# Patient Record
Sex: Male | Born: 1995 | Race: Black or African American | Hispanic: No | Marital: Single | Smoking: Current some day smoker
Health system: Southern US, Community
[De-identification: ages and names within clinical notes are randomized; demographics above are authoritative.]

## PROBLEM LIST (undated history)

## (undated) ENCOUNTER — Ambulatory Visit (HOSPITAL_COMMUNITY): Admission: EM

## (undated) ENCOUNTER — Ambulatory Visit (HOSPITAL_COMMUNITY): Discharge status: Absent without leave | Payer: Self-pay

## (undated) ENCOUNTER — Ambulatory Visit (HOSPITAL_COMMUNITY)

## (undated) DIAGNOSIS — K5090 Crohn's disease, unspecified, without complications: Secondary | ICD-10-CM

## (undated) DIAGNOSIS — A6000 Herpesviral infection of urogenital system, unspecified: Secondary | ICD-10-CM

---
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
## 2017-04-17 ENCOUNTER — Encounter (HOSPITAL_COMMUNITY): Payer: Self-pay

## 2017-04-17 ENCOUNTER — Ambulatory Visit (HOSPITAL_COMMUNITY)
Admission: EM | Admit: 2017-04-17 | Discharge: 2017-04-17 | Disposition: A | Discharge status: Home / Self Care | Payer: Medicaid Other | Attending: Family Medicine | Admitting: Family Medicine

## 2017-04-17 DIAGNOSIS — L739 Follicular disorder, unspecified: Secondary | ICD-10-CM

## 2017-04-17 HISTORY — DX: Herpesviral infection of urogenital system, unspecified: A60.00

## 2017-04-17 MED ORDER — CEPHALEXIN 500 MG PO CAPS: 500.0000 mg | ORAL_CAPSULE | Freq: Three times a day (TID) | ORAL | 0 refills | Status: DC

## 2017-04-17 NOTE — ED Triage Notes (Signed)
Pt complains today of a rash around genital area that he noticed 3 days ago. States there is not itching or burning associated with rash. No urinary sxs noticed.

## 2017-04-17 NOTE — ED Provider Notes (Signed)
  [facility]   [id number] 04/17/17 Arrival Time: 1200  ASSESSMENT & PLAN:  1. Folliculitis    Meds ordered this encounter  Medications  . cephALEXin (KEFLEX) 500 MG capsule    Sig: Take 1 capsule (500 mg total) by mouth 3 (three) times daily.    Dispense:  21 capsule    Refill:  0   Also desires testing for GC/Chlamydia. Urine sent. Will contact with results. No empiric treatment.  Reviewed expectations re: course of current medical issues. Questions answered. Outlined signs and symptoms indicating need for more acute intervention. Patient verbalized understanding. After Visit Summary given.   SUBJECTIVE:  Henry Lin is a 21 y.o. male who presents with complaint of "bumps" on proximal penis. Noticed approx 2 evenings ago. Improving. Not painful. Mild itch. Afebrile. Desires testing for GC/Chlamydia. No penile d/c. Is sexually active with male partner. Occas condom use.  ROS: As per HPI.   OBJECTIVE:  Vitals:   04/17/17 1216  BP: 107/77  Pulse: 81  Temp: 98.4 F (36.9 C)  SpO2: 96%     General appearance: alert; no distress Extremities: no cyanosis or edema; symmetrical with no gross deformities Skin: proximal penis with mild inflammation around hair follicles; no sites of drainage; non-tender; no ulcerations   Labs Reviewed  URINE CYTOLOGY ANCILLARY ONLY    No Known Allergies  PMHx, SurgHx, SocialHx, Medications, and Allergies were reviewed in the Visit Navigator and updated as appropriate.      Mardella Layman, MD 04/17/17 1240

## 2017-04-19 LAB — URINE CYTOLOGY ANCILLARY ONLY
CHLAMYDIA, DNA PROBE: NEGATIVE
NEISSERIA GONORRHEA: NEGATIVE

## 2017-07-15 ENCOUNTER — Encounter (HOSPITAL_COMMUNITY): Payer: Self-pay | Admitting: Family Medicine

## 2017-07-15 ENCOUNTER — Ambulatory Visit (HOSPITAL_COMMUNITY)
Admission: EM | Admit: 2017-07-15 | Discharge: 2017-07-15 | Disposition: A | Discharge status: Home / Self Care | Payer: Medicaid Other | Attending: Family Medicine | Admitting: Family Medicine

## 2017-07-15 DIAGNOSIS — Z113 Encounter for screening for infections with a predominantly sexual mode of transmission: Secondary | ICD-10-CM | POA: Diagnosis not present

## 2017-07-15 DIAGNOSIS — Z202 Contact with and (suspected) exposure to infections with a predominantly sexual mode of transmission: Secondary | ICD-10-CM | POA: Diagnosis not present

## 2017-07-15 MED ORDER — VALACYCLOVIR HCL 500 MG PO TABS: 500.0000 mg | ORAL_TABLET | Freq: Two times a day (BID) | ORAL | 11 refills | Status: DC

## 2017-07-15 MED ORDER — LIDOCAINE HCL (PF) 1 % IJ SOLN
INTRAMUSCULAR | Status: AC
  Filled 2017-07-15: qty 2

## 2017-07-15 MED ORDER — CEFTRIAXONE SODIUM 250 MG IJ SOLR
INTRAMUSCULAR | Status: AC
  Filled 2017-07-15: qty 250

## 2017-07-15 MED ORDER — AZITHROMYCIN 250 MG PO TABS
1000.0000 mg | ORAL_TABLET | Freq: Once | ORAL | Status: AC
  Administered 2017-07-15: 1000 mg via ORAL

## 2017-07-15 MED ORDER — AZITHROMYCIN 250 MG PO TABS
ORAL_TABLET | ORAL | Status: AC
  Filled 2017-07-15: qty 4

## 2017-07-15 MED ORDER — CEFTRIAXONE SODIUM 250 MG IJ SOLR
250.0000 mg | Freq: Once | INTRAMUSCULAR | Status: AC
  Administered 2017-07-15: 250 mg via INTRAMUSCULAR

## 2017-07-15 NOTE — ED Provider Notes (Signed)
  [facility]   [id number] 07/15/17 Arrival Time: 1001   SUBJECTIVE:  Henry Lin is a 21 y.o. male who presents to the urgent care with complaint of tingling in tip of penis and has noticed discharge for the same period of time.  One week.  Had chlamydia in past.  No rash lately.  Takes Valtrex prn  Phone is out of service but patient knows to check mychart     Past Medical History:  Diagnosis Date  . Crohn's disease (HCC)   . Genital herpes    No family history on file. Social History   Social History  . Marital status: Single    Spouse name: N/A  . Number of children: N/A  . Years of education: N/A   Occupational History  . Not on file.   Social History Main Topics  . Smoking status: Never Smoker  . Smokeless tobacco: Never Used  . Alcohol use No  . Drug use: No  . Sexual activity: Yes     Comment: condoms   Other Topics Concern  . Not on file   Social History Narrative  . No narrative on file   No outpatient prescriptions have been marked as taking for the 07/15/17 encounter [facility] Encounter).   No Known Allergies    ROS: As per HPI, remainder of ROS negative.   OBJECTIVE:   Vitals:   07/15/17 1011  BP: 119/72  Pulse: (!) 101  Resp: 18  Temp: 97.6 F (36.4 C)  TempSrc: Oral  SpO2: 98%     General appearance: alert; no distress Eyes: PERRL; EOMI; conjunctiva normal HENT: normocephalic; atraumatic; external ears normal without trauma; nasal mucosa normal; oral mucosa normal Neck: supple Genitalia:  Dry exudate at tip of penis.  No rash Skin: warm and dry Neurologic: normal gait; grossly normal Psychological: alert and cooperative; normal mood and affect      Labs:  Results for orders placed or performed during the hospital encounter of 04/17/17  Urine cytology ancillary only  Result Value Ref Range   Chlamydia Negative    Neisseria gonorrhea Negative     Labs Reviewed  URINE CYTOLOGY ANCILLARY ONLY     No results found.     ASSESSMENT & PLAN:  1. STD exposure     Meds ordered this encounter  Medications  . azithromycin (ZITHROMAX) tablet 1,000 mg  . cefTRIAXone (ROCEPHIN) injection 250 mg  . valACYclovir (VALTREX) 500 MG tablet    Sig: Take 1 tablet (500 mg total) by mouth 2 (two) times daily.    Dispense:  6 tablet    Refill:  11    Reviewed expectations re: course of current medical issues. Questions answered. Outlined signs and symptoms indicating need for more acute intervention. Patient verbalized understanding. After Visit Summary given.    Procedures:      Elvina Sidle, MD 07/15/17 1018

## 2017-07-15 NOTE — ED Triage Notes (Signed)
Tinging in tip of penis and has noticed discharge for the same period of time

## 2017-07-16 LAB — URINE CYTOLOGY ANCILLARY ONLY
Chlamydia: NEGATIVE
Neisseria Gonorrhea: NEGATIVE
Trichomonas: NEGATIVE

## 2017-07-25 ENCOUNTER — Other Ambulatory Visit: Payer: Self-pay

## 2017-07-25 ENCOUNTER — Emergency Department (HOSPITAL_COMMUNITY)
Admission: EM | Admit: 2017-07-25 | Discharge: 2017-07-25 | Disposition: A | Discharge status: Home / Self Care | Payer: Self-pay | Attending: Emergency Medicine | Admitting: Emergency Medicine

## 2017-07-25 ENCOUNTER — Ambulatory Visit (HOSPITAL_COMMUNITY)
Admission: EM | Admit: 2017-07-25 | Discharge: 2017-07-25 | Disposition: A | Discharge status: Home / Self Care | Payer: Self-pay

## 2017-07-25 ENCOUNTER — Encounter (HOSPITAL_COMMUNITY): Payer: Self-pay | Admitting: Emergency Medicine

## 2017-07-25 ENCOUNTER — Emergency Department (HOSPITAL_COMMUNITY): Payer: Self-pay

## 2017-07-25 DIAGNOSIS — M542 Cervicalgia: Secondary | ICD-10-CM | POA: Insufficient documentation

## 2017-07-25 DIAGNOSIS — M549 Dorsalgia, unspecified: Secondary | ICD-10-CM | POA: Insufficient documentation

## 2017-07-25 DIAGNOSIS — M5489 Other dorsalgia: Secondary | ICD-10-CM

## 2017-07-25 DIAGNOSIS — G4489 Other headache syndrome: Secondary | ICD-10-CM | POA: Insufficient documentation

## 2017-07-25 DIAGNOSIS — Z79899 Other long term (current) drug therapy: Secondary | ICD-10-CM | POA: Insufficient documentation

## 2017-07-25 LAB — CBC WITH DIFFERENTIAL/PLATELET
BASOS ABS: 0.2 10*3/uL — AB (ref 0.0–0.1)
BASOS PCT: 4 %
EOS PCT: 1 %
Eosinophils Absolute: 0.1 10*3/uL (ref 0.0–0.7)
HEMATOCRIT: 41.3 % (ref 39.0–52.0)
HEMOGLOBIN: 13.6 g/dL (ref 13.0–17.0)
LYMPHS PCT: 53 %
Lymphs Abs: 2.8 10*3/uL (ref 0.7–4.0)
MCH: 25.4 pg — ABNORMAL LOW (ref 26.0–34.0)
MCHC: 32.9 g/dL (ref 30.0–36.0)
MCV: 77.1 fL — ABNORMAL LOW (ref 78.0–100.0)
MONOS PCT: 11 %
Monocytes Absolute: 0.6 10*3/uL (ref 0.1–1.0)
NEUTROS ABS: 1.6 10*3/uL — AB (ref 1.7–7.7)
NEUTROS PCT: 31 %
Platelets: 169 10*3/uL (ref 150–400)
RBC: 5.36 MIL/uL (ref 4.22–5.81)
RDW: 14.2 % (ref 11.5–15.5)
WBC: 5.3 10*3/uL (ref 4.0–10.5)

## 2017-07-25 LAB — BASIC METABOLIC PANEL
ANION GAP: 7 (ref 5–15)
BUN: 8 mg/dL (ref 6–20)
CHLORIDE: 106 mmol/L (ref 101–111)
CO2: 23 mmol/L (ref 22–32)
Calcium: 9.1 mg/dL (ref 8.9–10.3)
Creatinine, Ser: 0.91 mg/dL (ref 0.61–1.24)
GFR calc non Af Amer: >60 mL/min (ref >60)
Glucose, Bld: 81 mg/dL (ref 65–99)
Potassium: 4 mmol/L (ref 3.5–5.1)
Sodium: 136 mmol/L (ref 135–145)

## 2017-07-25 MED ORDER — ONDANSETRON HCL 4 MG/2ML IJ SOLN
4.0000 mg | Freq: Once | INTRAMUSCULAR | Status: AC
  Administered 2017-07-25: 4 mg via INTRAVENOUS
  Filled 2017-07-25: qty 2

## 2017-07-25 MED ORDER — CYCLOBENZAPRINE HCL 5 MG PO TABS: 5.0000 mg | ORAL_TABLET | Freq: Two times a day (BID) | ORAL | 0 refills | Status: DC | PRN

## 2017-07-25 MED ORDER — SODIUM CHLORIDE 0.9 % IV SOLN
Freq: Once | INTRAVENOUS | Status: AC
  Administered 2017-07-25: 21:00:00 via INTRAVENOUS

## 2017-07-25 MED ORDER — DEXAMETHASONE SODIUM PHOSPHATE 10 MG/ML IJ SOLN
10.0000 mg | Freq: Once | INTRAMUSCULAR | Status: AC
  Administered 2017-07-25: 10 mg via INTRAVENOUS
  Filled 2017-07-25: qty 1

## 2017-07-25 MED ORDER — DIPHENHYDRAMINE HCL 50 MG/ML IJ SOLN
25.0000 mg | Freq: Once | INTRAMUSCULAR | Status: AC
  Administered 2017-07-25: 25 mg via INTRAVENOUS
  Filled 2017-07-25: qty 1

## 2017-07-25 NOTE — ED Notes (Signed)
EDP at bedside  

## 2017-07-25 NOTE — ED Notes (Signed)
Patient Alert and oriented X4. Stable and ambulatory. Patient verbalized understanding of the discharge instructions.  Patient belongings were taken by the patient.  

## 2017-07-25 NOTE — Discharge Instructions (Signed)
Take tylenol and ibuprofen as needed. If you take the muscle relaxant do not drive as it can make you sleepy. Follow up with your doctor or return here for worsening symptoms.

## 2017-07-25 NOTE — ED Triage Notes (Signed)
C/o pain from back of head to lower back.  Pt states pain started 9 days ago as posterior neck pain.  He noticed a lump in the back of his neck that seemed to get smaller and then pain started to radiate down to lower back and up to back of head.  Denies fever, nausea, vomiting, or visual disturbance.

## 2017-07-25 NOTE — ED Provider Notes (Signed)
[facility] EMERGENCY DEPARTMENT Provider Note   CSN: [id number] Arrival date & time: 07/25/17  1743     History   Chief Complaint Chief Complaint  Patient presents with  . Neck Pain  . Back Pain  . Headache    HPI Henry Lin is a 21 y.o. male who presents to the ED with neck pain, headache and back pain that started 8 days ago and has gotten worse. Patient reports that he has a hx of migraines but this one won't go away. Patient denies fever, n/v visual changes.   The history is provided by the patient. No language interpreter was used.  Neck Pain   This is a new problem. The current episode started more than 1 week ago. The problem occurs constantly. The problem has been gradually worsening. The pain is associated with nothing. There has been no fever. The pain is present in the occipital region. The quality of the pain is described as aching. The pain is at a severity of 9/10. The symptoms are aggravated by position and twisting. The pain is the same all the time. Associated symptoms include headaches. Pertinent negatives include no photophobia, no visual change, no chest pain, no syncope, no bowel incontinence, no bladder incontinence, no leg pain and no weakness. He has tried muscle relaxants for the symptoms. The treatment provided mild relief.  Back Pain   This is a new problem. The current episode started more than 2 days ago. The problem occurs constantly. The problem has been gradually worsening. The pain is associated with no known injury. The pain is present in the lumbar spine. The quality of the pain is described as aching. The pain does not radiate. The pain is at a severity of 10/10. The symptoms are aggravated by bending and twisting. The pain is the same all the time. Associated symptoms include headaches. Pertinent negatives include no chest pain, no fever, no abdominal pain, no bowel incontinence, no bladder incontinence, no dysuria, no leg pain  and no weakness.  Headache   This is a new problem. The current episode started more than 2 days ago (3 days ago). The problem occurs every few hours. The problem has been gradually worsening. The pain is located in the occipital and frontal region. The pain is at a severity of 10/10. The pain radiates to the upper back. Pertinent negatives include no fever, no near-syncope, no syncope, no shortness of breath, no nausea and no vomiting.    Past Medical History:  Diagnosis Date  . Crohn's disease (HCC)   . Genital herpes     There are no active problems to display for this patient.   History reviewed. No pertinent surgical history.     Home Medications    Prior to Admission medications   Medication Sig Start Date End Date Taking? Authorizing Provider  cyclobenzaprine (FLEXERIL) 5 MG tablet Take 1 tablet (5 mg total) 2 (two) times daily as needed by mouth for muscle spasms (neck and back spasm). 07/25/17   Janne Napoleon, NP  valACYclovir (VALTREX) 500 MG tablet Take 1 tablet (500 mg total) by mouth 2 (two) times daily. 07/15/17   Elvina Sidle, MD    Family History No family history on file.  Social History Social History   Tobacco Use  . Smoking status: Never Smoker  . Smokeless tobacco: Never Used  Substance Use Topics  . Alcohol use: No  . Drug use: No     Allergies  Patient has no known allergies.   Review of Systems Review of Systems  Constitutional: Negative for diaphoresis, fever and unexpected weight change.  HENT: Negative.   Eyes: Negative for photophobia and visual disturbance.  Respiratory: Negative for cough and shortness of breath.   Cardiovascular: Negative for chest pain, syncope and near-syncope.  Gastrointestinal: Negative for abdominal pain, bowel incontinence, constipation, diarrhea, nausea and vomiting.  Genitourinary: Negative for bladder incontinence, decreased urine volume, discharge, dysuria, frequency and hematuria.  Musculoskeletal:  Positive for back pain and neck pain.  Skin: Negative for rash and wound.  Neurological: Positive for light-headedness and headaches. Negative for syncope and weakness.  Psychiatric/Behavioral: Negative for confusion. The patient is not nervous/anxious.      Physical Exam Updated Vital Signs BP 123/68 (BP Location: Right Arm)   Pulse 71   Temp 98 F (36.7 C) (Oral)   Resp 16   Ht 5\' 10"  (1.778 m)   Wt 83.9 kg (185 lb)   SpO2 100%   BMI 26.54 kg/m   Physical Exam  Constitutional: He appears well-developed and well-nourished. No distress.  HENT:  Head: Normocephalic and atraumatic.  Right Ear: Tympanic membrane normal.  Left Ear: Tympanic membrane normal.  Nose: Nose normal.  Mouth/Throat: Uvula is midline and oropharynx is clear and moist.  Eyes: EOM are normal. Pupils are equal, round, and reactive to light.  Neck: Normal range of motion. Neck supple.  Cardiovascular: Normal rate and regular rhythm.  Pulmonary/Chest: Effort normal. No respiratory distress. He has no wheezes. He has no rales.  Abdominal: Soft. Bowel sounds are normal. There is no tenderness.  Musculoskeletal: Normal range of motion. He exhibits no edema.       Lumbar back: He exhibits tenderness. He exhibits normal range of motion, no deformity, no spasm and normal pulse.  Neurological: He is alert. He has normal strength. No cranial nerve deficit or sensory deficit. He displays a negative Romberg sign. Coordination and gait normal.  Reflex Scores:      Bicep reflexes are 2+ on the right side and 2+ on the left side.      Brachioradialis reflexes are 2+ on the right side and 2+ on the left side.      Patellar reflexes are 2+ on the right side and 2+ on the left side. Skin: Skin is warm and dry.  Psychiatric: He has a normal mood and affect. His behavior is normal.  Nursing note and vitals reviewed.  8:50 pm Dr. Ethelda Chick in to examine the patient.  Will continue plan to CT head and IV fluids and headache  cocktail of Benadryl 25 mg IV, Zofran 4 mg IV, Decadron 10 mg IV., labs and observe.  Dr. Ethelda Chick discussed with patient possibility of LP, however, since patient's symptoms started over a week ago and he has had no fever and no neuro deficits patient states that he does not want the LP if symptoms improve with treatment.   ED Treatments / Results  Labs (all labs ordered are listed, but only abnormal results are displayed) Labs Reviewed  CBC WITH DIFFERENTIAL/PLATELET - Abnormal; Notable for the following components:      Result Value   MCV 77.1 (*)    MCH 25.4 (*)    Neutro Abs 1.6 (*)    Basophils Absolute 0.2 (*)    All other components within normal limits  BASIC METABOLIC PANEL     Radiology Ct Head Wo Contrast  Result Date: 07/25/2017 CLINICAL DATA:  Acute headache. Started 9  days ago. Posterior neck pain. Lump in the back of the neck. EXAM: CT HEAD WITHOUT CONTRAST TECHNIQUE: Contiguous axial images were obtained from the base of the skull through the vertex without intravenous contrast. COMPARISON:  None. FINDINGS: Brain: No evidence of acute infarction, hemorrhage, hydrocephalus, extra-axial collection or mass lesion/mass effect. Vascular: No hyperdense vessel or unexpected calcification. Skull: Normal. Negative for fracture or focal lesion. Sinuses/Orbits: Retention cysts in the maxillary antra. No acute air-fluid levels in the paranasal sinuses. Mastoid air cells are not opacified. Other: None. IMPRESSION: No acute intracranial abnormalities. Electronically Signed   By: Burman NievesWilliam  Stevens M.D.   On: 07/25/2017 22:36    Procedures Procedures (including critical care time)  Medications Ordered in ED Medications  0.9 %  sodium chloride infusion ( Intravenous Stopped 07/25/17 2241)  dexamethasone (DECADRON) injection 10 mg (10 mg Intravenous Given 07/25/17 2123)  diphenhydrAMINE (BENADRYL) injection 25 mg (25 mg Intravenous Given 07/25/17 2123)  ondansetron (ZOFRAN) injection 4  mg (4 mg Intravenous Given 07/25/17 2123)     Initial Impression / Assessment and Plan / ED Course  I have reviewed the triage vital signs and the nursing notes. 21 y.o. male with headache and neck pain stable for d/c with headache completely resolved with medications and IV hydration during his ED visit. Discussed with the patient importance of staying well hydrated and need for f/u with his PCP. Patient voices understanding and agrees with plan. Discussed s/s that would indicate need for return to the ED.   Final Clinical Impressions(s) / ED Diagnoses   Final diagnoses:  Other headache syndrome  Neck pain  Back pain without sciatica    ED Discharge Orders    None       Kerrie Buffaloeese, Hope Renner CornerM, NP 07/26/17 [id number]    Doug SouJacubowitz, Sam, MD 07/26/17 [phone]

## 2017-07-25 NOTE — ED Provider Notes (Addendum)
Complains of posterior neck pain and diffuse back pain covering the entire back onset 1 week ago accompanied by diffuse headaches.  No fever.  He is treated himself with  Advil prior to coming here with significant relief.  No nausea or vomiting no rash.  No one else at home sick.  Denies any sore throat.  On exam with patient is alert nontoxic HEENT exam normocephalic atraumatic no facial asymmetry eyes normal neck is supple he has mild pain on flexion of his neck at the posterior neck.  He is able to flex his neck easily.  Lungs clear to auscultation heart regular rate and rhythm no murmurs neurologic Glasgow Coma Score 15 cranial nerves II through XII grossly intact moves all extremities well. I strongly doubt meningitis.  I discussed with patient and with his mother that if he had meningitis of the bacterial sort lasting 1 week he would likely be very ill with high fever.  I do not feel that emergent LP is indicated.  Patient in agreement.   Doug Sou, MD 07/25/17 2132    Doug Sou, MD 07/25/17 2214

## 2017-12-06 ENCOUNTER — Ambulatory Visit (INDEPENDENT_AMBULATORY_CARE_PROVIDER_SITE_OTHER): Payer: Self-pay

## 2017-12-06 ENCOUNTER — Ambulatory Visit (HOSPITAL_COMMUNITY)
Admission: EM | Admit: 2017-12-06 | Discharge: 2017-12-06 | Disposition: A | Discharge status: Home / Self Care | Payer: Self-pay | Attending: Internal Medicine | Admitting: Internal Medicine

## 2017-12-06 ENCOUNTER — Encounter (HOSPITAL_COMMUNITY): Payer: Self-pay | Admitting: Emergency Medicine

## 2017-12-06 DIAGNOSIS — M76891 Other specified enthesopathies of right lower limb, excluding foot: Secondary | ICD-10-CM

## 2017-12-06 MED ORDER — NAPROXEN 500 MG PO TABS: 500.0000 mg | ORAL_TABLET | Freq: Two times a day (BID) | ORAL | 0 refills | Status: DC

## 2017-12-06 NOTE — ED Provider Notes (Signed)
[facility]    CSN: [id number] Arrival date & time: 12/06/17  1217     History   Chief Complaint Chief Complaint  Patient presents with  . Knee Pain    HPI Henry Lin is a 22 y.o. male.   Henry Lin presents with complaints of right knee pain after playing football two days ago. He states he had not stretched prior and was running routes and sprints. He developed posterior knee pain but was able to continue. Then started playing a game, another player ran into him striking his lateral knee, increasing the pain to his posterior knee. He states he was unable to walk that day. He is now ambulatory with a limp. Pain with knee extension. Pain is 8/10. Denies previous knee injury. Has not taken any medications for pain. Applied ice originally but has not since. Without weakness, numbness or tingling. History of crohns. Is not taking any medications regularly.   ROS per HPI.       Past Medical History:  Diagnosis Date  . Crohn's disease (HCC)   . Genital herpes     There are no active problems to display for this patient.   History reviewed. No pertinent surgical history.     Home Medications    Prior to Admission medications   Medication Sig Start Date End Date Taking? Authorizing Provider  cyclobenzaprine (FLEXERIL) 5 MG tablet Take 1 tablet (5 mg total) 2 (two) times daily as needed by mouth for muscle spasms (neck and back spasm). 07/25/17   Janne Napoleon, NP  naproxen (NAPROSYN) 500 MG tablet Take 1 tablet (500 mg total) by mouth 2 (two) times daily. 12/06/17   Georgetta Haber, NP  valACYclovir (VALTREX) 500 MG tablet Take 1 tablet (500 mg total) by mouth 2 (two) times daily. 07/15/17   Elvina Sidle, MD    Family History History reviewed. No pertinent family history.  Social History Social History   Tobacco Use  . Smoking status: Never Smoker  . Smokeless tobacco: Never Used  Substance Use Topics  . Alcohol use: No  . Drug use: No      Allergies   Patient has no known allergies.   Review of Systems Review of Systems   Physical Exam Triage Vital Signs ED Triage Vitals [12/06/17 1335]  Enc Vitals Group     BP 123/67     Pulse Rate 67     Resp 18     Temp 98.5 F (36.9 C)     Temp Source Oral     SpO2 100 %     Weight      Height      Head Circumference      Peak Flow      Pain Score      Pain Loc      Pain Edu?      Excl. in GC?    No data found.  Updated Vital Signs BP 123/67 (BP Location: Left Arm)   Pulse 67   Temp 98.5 F (36.9 C) (Oral)   Resp 18   SpO2 100%   Visual Acuity Right Eye Distance:   Left Eye Distance:   Bilateral Distance:    Right Eye Near:   Left Eye Near:    Bilateral Near:     Physical Exam  Constitutional: He is oriented to person, place, and time. He appears well-developed and well-nourished.  Cardiovascular: Normal rate and regular rhythm.  Pulmonary/Chest: Effort normal and breath sounds normal.  Musculoskeletal:       Right knee: He exhibits decreased range of motion. He exhibits no swelling, no effusion, no ecchymosis, no deformity, no laceration, no erythema, normal alignment, no LCL laxity, normal patellar mobility, no bony tenderness, normal meniscus and no MCL laxity.       Legs: Pain at posterior knee on palpation as well as wrapping around to medial knee; pain with knee extension both passively and actively; pain with lifting of thigh with engagement of quadricepts and hamstring; negative anterior drawer sign; without laxity noted but with pain with stress to medial knee; sensation intact; strong pedal pulses; ambulatory with limp; without swelling or bruising noted  Neurological: He is alert and oriented to person, place, and time.  Skin: Skin is warm and dry.     UC Treatments / Results  Labs (all labs ordered are listed, but only abnormal results are displayed) Labs Reviewed - No data to display  EKG  EKG Interpretation None        Radiology Dg Knee Complete 4 Views Right  Result Date: 12/06/2017 CLINICAL DATA:  Injury while playing football EXAM: RIGHT KNEE - COMPLETE 4+ VIEW COMPARISON:  June 09, 2015 FINDINGS: Frontal, lateral, and bilateral oblique views were obtained. There is no fracture or dislocation. No joint effusion. The joint spaces appear normal. No erosive change. IMPRESSION: No fracture or dislocation. No joint effusion. No evident arthropathy. Electronically Signed   By: Bretta Bang III M.D.   On: 12/06/2017 13:48    Procedures Procedures (including critical care time)  Medications Ordered in UC Medications - No data to display   Initial Impression / Assessment and Plan / UC Course  I have reviewed the triage vital signs and the nursing notes.  Pertinent labs & imaging results that were available during my care of the patient were reviewed by me and considered in my medical decision making (see chart for details).     Likely hamstring tendon strain vs tendonitis. ACE wrap and ice applied. Naproxen twice a day, take with food. Activity as tolerated. Ice and elevation. Follow up with orthopedics as needed if no improvement in the next 10 days. Patient verbalized understanding and agreeable to plan.    Final Clinical Impressions(s) / UC Diagnoses   Final diagnoses:  Hamstring tendonitis of right thigh    ED Discharge Orders        Ordered    naproxen (NAPROSYN) 500 MG tablet  2 times daily     12/06/17 1431       Controlled Substance Prescriptions [place] Controlled Substance Registry consulted? Not Applicable   Georgetta Haber, NP 12/06/17 1442

## 2017-12-06 NOTE — Discharge Instructions (Signed)
Ice, elevation, naproxen twice a day, use of compression. Activity as tolerated. If no improvement or worsening in the next 10 days please follow up with orthopedics

## 2017-12-06 NOTE — ED Triage Notes (Signed)
Pt sts right knee pain after twisting knee on Saturday

## 2018-08-27 ENCOUNTER — Ambulatory Visit (HOSPITAL_COMMUNITY)
Admission: EM | Admit: 2018-08-27 | Discharge: 2018-08-27 | Disposition: A | Discharge status: Home / Self Care | Payer: Medicaid Other | Attending: Internal Medicine | Admitting: Internal Medicine

## 2018-08-27 ENCOUNTER — Encounter (HOSPITAL_COMMUNITY): Payer: Self-pay

## 2018-08-27 DIAGNOSIS — Z202 Contact with and (suspected) exposure to infections with a predominantly sexual mode of transmission: Secondary | ICD-10-CM

## 2018-08-27 DIAGNOSIS — Z7251 High risk heterosexual behavior: Secondary | ICD-10-CM

## 2018-08-27 DIAGNOSIS — R369 Urethral discharge, unspecified: Secondary | ICD-10-CM

## 2018-08-27 DIAGNOSIS — A630 Anogenital (venereal) warts: Secondary | ICD-10-CM

## 2018-08-27 MED ORDER — AZITHROMYCIN 250 MG PO TABS
ORAL_TABLET | ORAL | Status: AC
  Filled 2018-08-27: qty 4

## 2018-08-27 MED ORDER — AZITHROMYCIN 250 MG PO TABS
1000.0000 mg | ORAL_TABLET | Freq: Once | ORAL | Status: AC
  Administered 2018-08-27: 1000 mg via ORAL

## 2018-08-27 MED ORDER — CEFTRIAXONE SODIUM 250 MG IJ SOLR
250.0000 mg | Freq: Once | INTRAMUSCULAR | Status: AC
  Administered 2018-08-27: 250 mg via INTRAMUSCULAR

## 2018-08-27 MED ORDER — CEFTRIAXONE SODIUM 250 MG IJ SOLR
INTRAMUSCULAR | Status: AC
  Filled 2018-08-27: qty 250

## 2018-08-27 NOTE — ED Notes (Signed)
Staff had pt to give a urine sample.

## 2018-08-27 NOTE — ED Triage Notes (Signed)
Pt present STD exposure from his girlfriend. Pt present some penis drainage for about a week.

## 2018-08-27 NOTE — Discharge Instructions (Addendum)
Due to recurrent STD's you've had, you are very high risk for contracting HIV.  Please follow up with Henry Lin and discuss possible preventive treatment for HIV.  Do not have sex til you have tested negative during your follow up.

## 2018-08-27 NOTE — ED Provider Notes (Signed)
[facility]    CSN: [id number] Arrival date & time: 08/27/18  1001     History   Chief Complaint Chief Complaint  Patient presents with  . Exposure to STD    HPI Henry Lin is a 22 y.o. male.   Present requesting treatment for Chlamydia due to his girl friend informing him 3 days ago, she has tested positive for Chlamydia. He has been with her x 3 years and denies having other sexual partners. Has hx of chlamydia 2018, and genital herpes 2016. Last HIV test was 6 months ago which was neg.  He has been having intermittent dysuria and mild penile discharge of clear color x 1 week. He declined having HIV or any blood work done. He does not have a PCP. Has not been able to get scheduled with [facility] when he called this week.      Past Medical History:  Diagnosis Date  . Crohn's disease (HCC)   . Genital herpes     There are no active problems to display for this patient.   History reviewed. No pertinent surgical history.     Home Medications    Prior to Admission medications   Medication Sig Start Date End Date Taking? Authorizing Provider  cyclobenzaprine (FLEXERIL) 5 MG tablet Take 1 tablet (5 mg total) 2 (two) times daily as needed by mouth for muscle spasms (neck and back spasm). 07/25/17   Janne Napoleon, NP  naproxen (NAPROSYN) 500 MG tablet Take 1 tablet (500 mg total) by mouth 2 (two) times daily. 12/06/17   Georgetta Haber, NP  valACYclovir (VALTREX) 500 MG tablet Take 1 tablet (500 mg total) by mouth 2 (two) times daily. 07/15/17   Elvina Sidle, MD    Family History History reviewed. No pertinent family history.  Social History Social History   Tobacco Use  . Smoking status: Never Smoker  . Smokeless tobacco: Never Used  Substance Use Topics  . Alcohol use: No  . Drug use: No     Allergies   Patient has no known allergies.   Review of Systems Review of Systems  Constitutional: Negative for appetite change, chills,  diaphoresis, fever and unexpected weight change.  HENT: Positive for congestion. Negative for ear discharge, ear pain and sore throat.        Has had stuffy nose x 2 days  Respiratory: Negative for cough and shortness of breath.   Genitourinary: Positive for discharge, dysuria and genital sores. Negative for flank pain, penile pain, penile swelling, scrotal swelling, testicular pain and urgency.  Skin: Positive for rash.       Nothing other that penile blisters which has been x 2 weeks and is out of his Valtrex.      Physical Exam Triage Vital Signs ED Triage Vitals  Enc Vitals Group     BP 08/27/18 1014 132/77     Pulse Rate 08/27/18 1014 85     Resp 08/27/18 1014 18     Temp 08/27/18 1014 97.7 F (36.5 C)     Temp Source 08/27/18 1014 Oral     SpO2 08/27/18 1014 99 %     Weight --      Height --      Head Circumference --      Peak Flow --      Pain Score 08/27/18 1015 0     Pain Loc --      Pain Edu? --      Excl.  in GC? --    No data found.  Updated Vital Signs BP 132/77 (BP Location: Right Arm)   Pulse 85   Temp 97.7 F (36.5 C) (Oral)   Resp 18   SpO2 99%   Visual Acuity Right Eye Distance:   Left Eye Distance:   Bilateral Distance:    Right Eye Near:   Left Eye Near:    Bilateral Near:     Physical Exam  Constitutional: He is oriented to person, place, and time. He appears well-developed and well-nourished. No distress.  HENT:  Head: Normocephalic.  Right Ear: External ear normal.  Left Ear: External ear normal.  Nose: Nose normal.  Eyes: Conjunctivae are normal. Right eye exhibits no discharge. Left eye exhibits no discharge. No scleral icterus.  Neck: Neck supple.  Pulmonary/Chest: Effort normal.  Genitourinary: No penile tenderness.  Genitourinary Comments: Has multiple raised skin color circular lesions of L proximal penis shaft from 1x36mm to 4x4 mm. No wounds noted or visible penile discharge. Onalee Hua  Chaperoned.   Neurological: He is alert and  oriented to person, place, and time.  Skin: Skin is warm and dry. He is not diaphoretic.  Psychiatric: He has a normal mood and affect. His behavior is normal. Judgment and thought content normal.  Nursing note and vitals reviewed.    UC Treatments / Results  Labs (all labs ordered are listed, but only abnormal results are displayed) Labs Reviewed  URINE CYTOLOGY ANCILLARY ONLY    EKG None  Radiology No results found.  Procedures Procedures   Medications Ordered in UC Medications  azithromycin (ZITHROMAX) tablet 1,000 mg (has no administration in time range)  cefTRIAXone (ROCEPHIN) injection 250 mg (has no administration in time range)    Initial Impression / Assessment and Plan / UC Course  I have reviewed the triage vital signs and the nursing notes. Urine STI testing sent out via urine and we will inform him when the results are done. He declined blood STD testing. He was advised to get established with a PCP and I gave him Buzzy Han information to do so and have FU.  In the mean time he was given Zithromax 1000 mg and Rocephin 250 MG IM here. I educated him how venereal warts are treated and wound need to see someone weekly for treatment.        Final Clinical Impressions(s) / UC Diagnoses   Final diagnoses:  Exposure to chlamydia  Venereal warts  High risk heterosexual behavior     Discharge Instructions     Due to recurrent STD's you've had, you are very high risk for contracting HIV.  Please follow up with Ms Tiburcio Pea and discuss possible preventive treatment for HIV.  Do not have sex til you have tested negative during your follow up.     ED Prescriptions    None     Controlled Substance Prescriptions [place] Controlled Substance Registry consulted?    Garey Ham, PA-C 08/27/18 1055

## 2018-08-29 ENCOUNTER — Telehealth (HOSPITAL_COMMUNITY): Payer: Self-pay | Admitting: Emergency Medicine

## 2018-08-29 LAB — URINE CYTOLOGY ANCILLARY ONLY
Chlamydia: POSITIVE — AB
Neisseria Gonorrhea: NEGATIVE
Trichomonas: NEGATIVE

## 2018-08-29 NOTE — Telephone Encounter (Signed)
Chlamydia is positive.  This was treated at the urgent care visit with po zithromax 1g.  Pt needs education to please refrain from sexual intercourse for 7 days to give the medicine time to work.  Sexual partners need to be notified and tested/treated.  Condoms may reduce risk of reinfection.  Recheck or followup with PCP for further evaluation if symptoms are not improving.  [facility] notified.  Pt contacted and made aware of results. All questions answered.

## 2018-09-01 ENCOUNTER — Ambulatory Visit: Payer: Self-pay | Admitting: Family Medicine

## 2018-09-24 ENCOUNTER — Encounter (HOSPITAL_COMMUNITY): Payer: Self-pay

## 2018-09-24 ENCOUNTER — Other Ambulatory Visit: Payer: Self-pay

## 2018-09-24 ENCOUNTER — Ambulatory Visit (HOSPITAL_COMMUNITY)
Admission: EM | Admit: 2018-09-24 | Discharge: 2018-09-24 | Disposition: A | Discharge status: Home / Self Care | Payer: Self-pay | Attending: Family Medicine | Admitting: Family Medicine

## 2018-09-24 DIAGNOSIS — Z113 Encounter for screening for infections with a predominantly sexual mode of transmission: Secondary | ICD-10-CM

## 2018-09-24 DIAGNOSIS — Z711 Person with feared health complaint in whom no diagnosis is made: Secondary | ICD-10-CM | POA: Insufficient documentation

## 2018-09-24 NOTE — ED Provider Notes (Signed)
[facility]    CSN: [id number] Arrival date & time: 09/24/18  1328     History   Chief Complaint Chief Complaint  Patient presents with  . SEXUALLY TRANSMITTED DISEASE    HPI Henry Lin is a 23 y.o. male.   23 year old male comes in for STD testing. Was positive for chlamydia recently and received treatment, would like to make sure it is clear. He is asymptomatic. Denies penile discharge, lesion, testicular swelling/pain. Denies fever, chills, night sweats. Denies urinary symptoms such as frequency, dysuria, hematuria. States has had oral sex since getting treatment.      Past Medical History:  Diagnosis Date  . Crohn's disease (HCC)   . Genital herpes     There are no active problems to display for this patient.   History reviewed. No pertinent surgical history.     Home Medications    Prior to Admission medications   Medication Sig Start Date End Date Taking? Authorizing Provider  valACYclovir (VALTREX) 500 MG tablet Take 1 tablet (500 mg total) by mouth 2 (two) times daily. 07/15/17   Elvina Sidle, MD    Family History History reviewed. No pertinent family history.  Social History Social History   Tobacco Use  . Smoking status: Never Smoker  . Smokeless tobacco: Never Used  Substance Use Topics  . Alcohol use: No  . Drug use: No     Allergies   Patient has no known allergies.   Review of Systems Review of Systems  Reason unable to perform ROS: See HPI as above.     Physical Exam Triage Vital Signs ED Triage Vitals  Enc Vitals Group     BP 09/24/18 1500 109/62     Pulse Rate 09/24/18 1500 69     Resp 09/24/18 1500 17     Temp 09/24/18 1500 98.6 F (37 C)     Temp Source 09/24/18 1500 Oral     SpO2 09/24/18 1500 100 %     Weight --      Height --      Head Circumference --      Peak Flow --      Pain Score 09/24/18 1501 0     Pain Loc --      Pain Edu? --      Excl. in GC? --    No data found.  Updated  Vital Signs BP 109/62 (BP Location: Left Arm)   Pulse 69   Temp 98.6 F (37 C) (Oral)   Resp 17   SpO2 100%   Visual Acuity Right Eye Distance:   Left Eye Distance:   Bilateral Distance:    Right Eye Near:   Left Eye Near:    Bilateral Near:     Physical Exam Constitutional:      General: He is not in acute distress.    Appearance: He is well-developed. He is not diaphoretic.  HENT:     Head: Normocephalic and atraumatic.  Eyes:     Conjunctiva/sclera: Conjunctivae normal.     Pupils: Pupils are equal, round, and reactive to light.  Neurological:     Mental Status: He is alert and oriented to person, place, and time.      UC Treatments / Results  Labs (all labs ordered are listed, but only abnormal results are displayed) Labs Reviewed  URINE CYTOLOGY ANCILLARY ONLY    EKG None  Radiology No results found.  Procedures Procedures (including critical care time)  Medications Ordered in UC Medications - No data to display  Initial Impression / Assessment and Plan / UC Course  I have reviewed the triage vital signs and the nursing notes.  Pertinent labs & imaging results that were available during my care of the patient were reviewed by me and considered in my medical decision making (see chart for details).    Cytology sent. Refrain from sexual intercourse. Return precautions given.  Final Clinical Impressions(s) / UC Diagnoses   Final diagnoses:  Concern about STD in male without diagnosis    ED Prescriptions    None        Belinda Fisher, PA-C 09/24/18 1550

## 2018-09-24 NOTE — Discharge Instructions (Signed)
Cytology sent, you will be contacted with any positive results that requires further treatment. Refrain from sexual activity for the next 7 days.  °

## 2018-09-24 NOTE — ED Triage Notes (Signed)
Pt presents to [facility] [place] for STD retesting, pt was seen in [facility] on 08/27/18 and was positive for chlamydia, wants to make sure it is gone.

## 2018-09-26 LAB — URINE CYTOLOGY ANCILLARY ONLY
Chlamydia: NEGATIVE
NEISSERIA GONORRHEA: NEGATIVE
TRICH (WINDOWPATH): NEGATIVE

## 2019-09-23 ENCOUNTER — Ambulatory Visit (HOSPITAL_COMMUNITY)
Admission: EM | Admit: 2019-09-23 | Discharge: 2019-09-23 | Disposition: A | Discharge status: Home / Self Care | Payer: Self-pay | Attending: Family Medicine | Admitting: Family Medicine

## 2019-09-23 ENCOUNTER — Encounter (HOSPITAL_COMMUNITY): Payer: Self-pay

## 2019-09-23 ENCOUNTER — Other Ambulatory Visit: Payer: Self-pay

## 2019-09-23 DIAGNOSIS — Z711 Person with feared health complaint in whom no diagnosis is made: Secondary | ICD-10-CM | POA: Insufficient documentation

## 2019-09-23 DIAGNOSIS — L509 Urticaria, unspecified: Secondary | ICD-10-CM | POA: Insufficient documentation

## 2019-09-23 DIAGNOSIS — R21 Rash and other nonspecific skin eruption: Secondary | ICD-10-CM | POA: Insufficient documentation

## 2019-09-23 DIAGNOSIS — Z113 Encounter for screening for infections with a predominantly sexual mode of transmission: Secondary | ICD-10-CM

## 2019-09-23 DIAGNOSIS — L508 Other urticaria: Secondary | ICD-10-CM

## 2019-09-23 MED ORDER — TRIAMCINOLONE ACETONIDE 0.1 % EX CREA: 1.0000 "application " | TOPICAL_CREAM | Freq: Three times a day (TID) | CUTANEOUS | 0 refills | Status: DC | PRN

## 2019-09-23 MED ORDER — METHYLPREDNISOLONE SODIUM SUCC 125 MG IJ SOLR
INTRAMUSCULAR | Status: AC
  Filled 2019-09-23: qty 2

## 2019-09-23 MED ORDER — PREDNISONE 20 MG PO TABS: ORAL_TABLET | ORAL | 0 refills | Status: DC

## 2019-09-23 MED ORDER — METHYLPREDNISOLONE SODIUM SUCC 125 MG IJ SOLR
80.0000 mg | Freq: Once | INTRAMUSCULAR | Status: AC
  Administered 2019-09-23: 12:00:00 80 mg via INTRAMUSCULAR

## 2019-09-23 NOTE — Discharge Instructions (Addendum)
Start oral prednisone today. You may start topical steriodal cream today. You may take benadryl at bedtime for itching as needed. You were given an injection of solumedrol (which is a form of prednisone) during your visit today to decrease inflammation and itching related to skin rash eruption. I would recommend follow-up with dermatologist as a few of the lesions on your skin have an appearance of a skin disorder called Psoriasis. I have included information about psoriasis with your discharge paperwork today. If this eruption reoccurs, I would recommend further work-up at dermatology.  You will be notifed of your STD results and wound culture results via Mychart.

## 2019-09-23 NOTE — ED Provider Notes (Signed)
[facility]    CSN: [id number] Arrival date & time: 09/23/19  1010      History   Chief Complaint Chief Complaint  Patient presents with   Rash    HPI Worth TRESTIN VENCES is a 24 y.o. male.   HPI  Saket T Harkless presents today with a concern for itchy rash that have progressed over the course of 2-3 days to his back, abdomen. The rash initially was localized to his arms. The rash is very itchy and patient has scratched and developed excoriated lesion on his back, abdomen, and extremities. Patient denies any recent illness. Medical history significant for crohn's disease and genital herpes. No known history of eczema, psoriasis, or reactions involving the skin.  Afebrile. No recent changes to detergents, foods, soaps, or topical lotions. Patient also request screening for STDs during visit today. Asymptomatic of STD symptoms.  Past Medical History:  Diagnosis Date   Crohn's disease ([place])    Genital herpes     There are no problems to display for this patient.   History reviewed. No pertinent surgical history.     Home Medications    Prior to Admission medications   Medication Sig Start Date End Date Taking? Authorizing Provider  valACYclovir (VALTREX) 500 MG tablet Take 1 tablet (500 mg total) by mouth 2 (two) times daily. 07/15/17   Robyn Haber, MD    Family History Family History  Problem Relation Age of Onset   Healthy Mother    Healthy Father     Social History Social History   Tobacco Use   Smoking status: Current Some Day Smoker    Types: Cigars   Smokeless tobacco: Never Used  Substance Use Topics   Alcohol use: No   Drug use: No     Allergies   Patient has no known allergies.   Review of Systems Review of Systems Pertinent negatives listed in HPI Physical Exam Triage Vital Signs ED Triage Vitals  Enc Vitals Group     BP 09/23/19 1045 (!) 122/58     Pulse Rate 09/23/19 1045 71     Resp 09/23/19 1045 16   Temp 09/23/19 1045 98.3 F (36.8 C)     Temp Source 09/23/19 1045 Oral     SpO2 09/23/19 1045 100 %     Weight --      Height --      Head Circumference --      Peak Flow --      Pain Score 09/23/19 1048 0     Pain Loc --      Pain Edu? --      Excl. in [place]? --    No data found.  Updated Vital Signs BP (!) 122/58 (BP Location: Left Arm)    Pulse 71    Temp 98.3 F (36.8 C) (Oral)    Resp 16    SpO2 100%   Visual Acuity Right Eye Distance:   Left Eye Distance:   Bilateral Distance:    Right Eye Near:   Left Eye Near:    Bilateral Near:     Physical Exam General appearance: alert, well developed, well nourished, cooperative and in no distress Head: Normocephalic, without obvious abnormality, atraumatic Respiratory: Respirations even and unlabored, normal respiratory rate Heart: rate and rhythm normal.  Extremities: No gross deformities Skin: Positive Rash and excoriated lesions (see photos) Psych: Appropriate mood and affect. Neurologic:  Alert, oriented to person, place, and time, thought content appropriate. Urethral cytology self  collected by patient            UC Treatments / Results  Labs (all labs ordered are listed, but only abnormal results are displayed) Labs Reviewed  AEROBIC CULTURE (SUPERFICIAL SPECIMEN)  CYTOLOGY, (ORAL, ANAL, URETHRAL) ANCILLARY ONLY    EKG   Radiology No results found.  Procedures Procedures (including critical care time)  Medications Ordered in UC Medications - No data to display  Initial Impression / Assessment and Plan / UC Course  I have reviewed the triage vital signs and the nursing notes.  Pertinent labs & imaging results that were available during my care of the patient were reviewed by me and considered in my medical decision making (see chart for details).   Rash/Skin eruption of unknown etiology. Lesions are mixed in appearance with macular-papular appearance and other localized circular-like  lesion  with dry, scale, / plaque like border-appearance suspicious for possible tinea and or psoriasis. Significant area of excoriation related to scratching therefore collected a wound culture to rule out secondary skin infection. Solumedrol 80 mg  administered today to reduced inflammation and pruritis. Given current level of pruritis and diffuse spread of rash will prescribe an oral prednisone taper to begin tomorrow. Topical steroidal cream prescribed for localized itching. Advised benadryl is helpful at bedtime to control itching. Advised to return if symptoms worsen. STD cytology pending.  Final Clinical Impressions(s) / UC Diagnoses   Final diagnoses:  Rash and nonspecific skin eruption  Urticaria  Concern about STD in male without diagnosis     Discharge Instructions     Start oral prednisone today. You may start topical steriodal cream today. You may take benadryl at bedtime for itching as needed. You were given an injection of solumedrol (which is a form of prednisone) during your visit today to decrease inflammation and itching related to skin rash eruption. I would recommend follow-up with dermatologist as a few of the lesions on your skin have an appearance of a skin disorder called Psoriasis. I have included information about psoriasis with your discharge paperwork today. If this eruption reoccurs, I would recommend further work-up at dermatology.  You will be notifed of your STD results and wound culture results via Mychart.    ED Prescriptions    Medication Sig Dispense Auth. Provider   predniSONE (DELTASONE) 20 MG tablet Start medication on 09/24/2019. Take 3 PO QAM x3days, 2 PO QAM x3days, 1 PO QAM x3days 18 tablet Bing Neighbors, FNP   triamcinolone cream (KENALOG) 0.1 % Apply 1 application topically 3 (three) times daily as needed. 454 g Bing Neighbors, FNP     PDMP not reviewed this encounter.   Bing Neighbors, FNP 09/23/19 1205

## 2019-09-23 NOTE — ED Triage Notes (Signed)
Patient presents to Urgent Care with complaints of itchy rash on his arms that have spread to his back, has not spread to his lower body yet since 3-4 days ago. Patient reports he has been trying not to scratch it, scabs noted on arms and some on back.

## 2019-09-25 LAB — AEROBIC CULTURE W GRAM STAIN (SUPERFICIAL SPECIMEN)
Gram Stain: NONE SEEN
Special Requests: NORMAL

## 2019-09-25 LAB — CYTOLOGY, (ORAL, ANAL, URETHRAL) ANCILLARY ONLY
Chlamydia: NEGATIVE
Neisseria Gonorrhea: NEGATIVE

## 2019-09-25 LAB — AEROBIC CULTURE? (SUPERFICIAL SPECIMEN)

## 2019-09-26 ENCOUNTER — Telehealth: Payer: Self-pay | Admitting: Emergency Medicine

## 2019-09-26 ENCOUNTER — Other Ambulatory Visit (HOSPITAL_COMMUNITY): Payer: Self-pay | Admitting: Family Medicine

## 2019-09-26 MED ORDER — SULFAMETHOXAZOLE-TRIMETHOPRIM 800-160 MG PO TABS: 1.0000 | ORAL_TABLET | Freq: Two times a day (BID) | ORAL | 0 refills | Status: AC

## 2019-09-26 NOTE — Telephone Encounter (Signed)
Henry Lin, You have an antibiotic called Bactrim x 7 days sent to the [facility] on [address]. If the rash hasn't improved with previously prescribed treatment, please come in for re-evaluation or see your primary care provider.  Attempted to reach patient. No answer at this time. No number on file is correct. Sent mychart comment and message.

## 2019-10-19 ENCOUNTER — Ambulatory Visit (INDEPENDENT_AMBULATORY_CARE_PROVIDER_SITE_OTHER): Payer: Self-pay

## 2019-10-19 ENCOUNTER — Ambulatory Visit (HOSPITAL_COMMUNITY)
Admission: EM | Admit: 2019-10-19 | Discharge: 2019-10-19 | Disposition: A | Discharge status: Home / Self Care | Payer: Self-pay | Attending: Family Medicine | Admitting: Family Medicine

## 2019-10-19 ENCOUNTER — Other Ambulatory Visit: Payer: Self-pay

## 2019-10-19 ENCOUNTER — Encounter (HOSPITAL_COMMUNITY): Payer: Self-pay

## 2019-10-19 DIAGNOSIS — Y9371 Activity, boxing: Secondary | ICD-10-CM

## 2019-10-19 DIAGNOSIS — S20212A Contusion of left front wall of thorax, initial encounter: Secondary | ICD-10-CM

## 2019-10-19 MED ORDER — IBUPROFEN 800 MG PO TABS: 800.0000 mg | ORAL_TABLET | Freq: Three times a day (TID) | ORAL | 0 refills | Status: DC

## 2019-10-19 NOTE — Discharge Instructions (Addendum)
Take ibuprofen 3 times a day with food Make sure you take deep breaths to prevent pneumonia Call or return if you develop any cough or fever

## 2019-10-19 NOTE — ED Triage Notes (Signed)
Pt presents with left side rib pain from boxing X 1 week.

## 2019-10-19 NOTE — ED Provider Notes (Signed)
[facility]    CSN: [id number] Arrival date & time: 10/19/19  1132      History   Chief Complaint Chief Complaint  Patient presents with  . Rib Pain    HPI Henry Lin is a 24 y.o. male.   HPI   Patient enjoys boxing.  He has been doing a lot of it recently.  He is taken several good hits to his left lower ribs.  1 of these jabs caused him to have severe rib pain.  Now there is a swollen area.  Tender to touch.  Hurts with deep breath.  Hurts with coughing.  Hurts with movement of his left arm.  He is concerned about a broken rib.States his coach said he probably had a cartilage injury.  He is taken no medicine for the pain. No cough, shortness of breath, or fever to indicate chest infection  Past Medical History:  Diagnosis Date  . Crohn's disease (HCC)   . Genital herpes     There are no problems to display for this patient.   History reviewed. No pertinent surgical history.     Home Medications    Prior to Admission medications   Medication Sig Start Date End Date Taking? Authorizing Provider  ibuprofen (ADVIL) 800 MG tablet Take 1 tablet (800 mg total) by mouth 3 (three) times daily. 10/19/19   Eustace Moore, MD  triamcinolone cream (KENALOG) 0.1 % Apply 1 application topically 3 (three) times daily as needed. 09/23/19   Bing Neighbors, FNP    Family History Family History  Problem Relation Age of Onset  . Healthy Mother   . Healthy Father     Social History Social History   Tobacco Use  . Smoking status: Current Some Day Smoker    Types: Cigars  . Smokeless tobacco: Never Used  Substance Use Topics  . Alcohol use: No  . Drug use: No     Allergies   Patient has no known allergies.   Review of Systems Review of Systems  Constitutional: Negative for appetite change, chills and fever.  HENT: Negative for congestion.   Respiratory: Negative for cough, chest tightness and shortness of breath.   Cardiovascular: Positive  for chest pain.     Physical Exam Triage Vital Signs ED Triage Vitals [10/19/19 1205]  Enc Vitals Group     BP 127/74     Pulse Rate 72     Resp 17     Temp 98.1 F (36.7 C)     Temp Source Oral     SpO2 98 %     Weight      Height      Head Circumference      Peak Flow      Pain Score 9     Pain Loc      Pain Edu?      Excl. in GC?    No data found.  Updated Vital Signs BP 127/74 (BP Location: Left Arm)   Pulse 72   Temp 98.1 F (36.7 C) (Oral)   Resp 17   SpO2 98%      Physical Exam Constitutional:      General: He is not in acute distress.    Appearance: He is well-developed and normal weight.  HENT:     Head: Normocephalic and atraumatic.     Mouth/Throat:     Comments: Mask in place Eyes:     Conjunctiva/sclera: Conjunctivae normal.  Pupils: Pupils are equal, round, and reactive to light.  Cardiovascular:     Rate and Rhythm: Normal rate.  Pulmonary:     Effort: Pulmonary effort is normal. No respiratory distress.  Abdominal:     General: There is no distension.     Palpations: Abdomen is soft.    Musculoskeletal:        General: Normal range of motion.     Cervical back: Normal range of motion.  Skin:    General: Skin is warm and dry.  Neurological:     Mental Status: He is alert.      UC Treatments / Results  Labs (all labs ordered are listed, but only abnormal results are displayed) Labs Reviewed - No data to display  EKG   Radiology DG Ribs Unilateral W/Chest Left  Result Date: 10/19/2019 CLINICAL DATA:  Injury while boxing EXAM: LEFT RIBS AND CHEST - 3+ VIEW COMPARISON:  None. FINDINGS: Frontal chest as well as oblique and cone-down rib images obtained. Lungs are clear. Heart size and pulmonary vascularity are normal. No adenopathy. No pneumothorax or pleural effusion. No evident rib fracture. IMPRESSION: Lungs clear.  No evident rib fracture. Electronically Signed   By: Lowella Grip III M.D.   On: 10/19/2019 12:42     Procedures Procedures (including critical care time)  Medications Ordered in UC Medications - No data to display  Initial Impression / Assessment and Plan / UC Course  I have reviewed the triage vital signs and the nursing notes.  Pertinent labs & imaging results that were available during my care of the patient were reviewed by me and considered in my medical decision making (see chart for details).     Reviewed rib contusion/costochondritis.  Treatment.  Slow recovery possible 6 to 8 weeks Final Clinical Impressions(s) / UC Diagnoses   Final diagnoses:  Rib contusion, left, initial encounter     Discharge Instructions     Take ibuprofen 3 times a day with food Make sure you take deep breaths to prevent pneumonia Call or return if you develop any cough or fever    ED Prescriptions    Medication Sig Dispense Auth. Provider   ibuprofen (ADVIL) 800 MG tablet Take 1 tablet (800 mg total) by mouth 3 (three) times daily. 21 tablet Raylene Everts, MD     PDMP not reviewed this encounter.   Raylene Everts, MD 10/19/19 1325

## 2020-03-14 ENCOUNTER — Ambulatory Visit (HOSPITAL_COMMUNITY)
Admission: EM | Admit: 2020-03-14 | Discharge: 2020-03-14 | Disposition: A | Discharge status: Home / Self Care | Payer: Self-pay | Attending: Physician Assistant | Admitting: Physician Assistant

## 2020-03-14 ENCOUNTER — Encounter (HOSPITAL_COMMUNITY): Payer: Self-pay

## 2020-03-14 DIAGNOSIS — N342 Other urethritis: Secondary | ICD-10-CM | POA: Insufficient documentation

## 2020-03-14 MED ORDER — DOXYCYCLINE HYCLATE 100 MG PO CAPS: 100.0000 mg | ORAL_CAPSULE | Freq: Two times a day (BID) | ORAL | 0 refills | Status: AC

## 2020-03-14 MED ORDER — LIDOCAINE HCL (PF) 1 % IJ SOLN
INTRAMUSCULAR | Status: AC
  Filled 2020-03-14: qty 2

## 2020-03-14 MED ORDER — CEFTRIAXONE SODIUM 500 MG IJ SOLR
500.0000 mg | Freq: Once | INTRAMUSCULAR | Status: AC
  Administered 2020-03-14: 500 mg via INTRAMUSCULAR

## 2020-03-14 MED ORDER — CEFTRIAXONE SODIUM 500 MG IJ SOLR
INTRAMUSCULAR | Status: AC
  Filled 2020-03-14: qty 500

## 2020-03-14 NOTE — Discharge Instructions (Signed)
We have sent your labs and will notify you of any needed changes to treatment  Take the doxycycline 2 times a day for 7 days Abstain from sex until all results and medications complete  Establish with the Internal medicine center for Primary care

## 2020-03-14 NOTE — ED Triage Notes (Signed)
Pt presents to UC with white cloudy penile discharge x 1 week.

## 2020-03-14 NOTE — ED Provider Notes (Signed)
[facility]    CSN: [id number] Arrival date & time: 03/14/20  4970      History   Chief Complaint Chief Complaint  Patient presents with  . Penile Discharge    HPI Henry Lin is a 24 y.o. male.   Patient presents for evaluation of urethral discharge and tingling when he urinates.  Reports symptoms of started over the last week.  He reports having sex with a new partner recently and the condom broke.  He does not report significant painful urination.  His biggest concern is the urethral discharge that is cloudy white.  He denies any testicular pain or swelling.  Denies any skin or genital rashes.  No known exposure to an STI   He also is asking for primary care resource.     Past Medical History:  Diagnosis Date  . Crohn's disease (HCC)   . Genital herpes     There are no problems to display for this patient.   History reviewed. No pertinent surgical history.     Home Medications    Prior to Admission medications   Medication Sig Start Date End Date Taking? Authorizing Provider  doxycycline (VIBRAMYCIN) 100 MG capsule Take 1 capsule (100 mg total) by mouth 2 (two) times daily for 7 days. 03/14/20 03/21/20  Talyia Allende, Veryl Speak, PA-C  ibuprofen (ADVIL) 800 MG tablet Take 1 tablet (800 mg total) by mouth 3 (three) times daily. 10/19/19   Eustace Moore, MD  triamcinolone cream (KENALOG) 0.1 % Apply 1 application topically 3 (three) times daily as needed. 09/23/19   Bing Neighbors, FNP    Family History Family History  Problem Relation Age of Onset  . Healthy Mother   . Healthy Father     Social History Social History   Tobacco Use  . Smoking status: Current Some Day Smoker    Types: Cigars  . Smokeless tobacco: Never Used  Vaping Use  . Vaping Use: Never used  Substance Use Topics  . Alcohol use: No  . Drug use: No     Allergies   Patient has no known allergies.   Review of Systems Review of Systems   Physical Exam Triage  Vital Signs ED Triage Vitals  Enc Vitals Group     BP 03/14/20 1014 124/66     Pulse Rate 03/14/20 1014 68     Resp 03/14/20 1014 16     Temp 03/14/20 1014 98.7 F (37.1 C)     Temp Source 03/14/20 1014 Oral     SpO2 03/14/20 1014 100 %     Weight --      Height --      Head Circumference --      Peak Flow --      Pain Score 03/14/20 1015 0     Pain Loc --      Pain Edu? --      Excl. in [place]? --    No data found.  Updated Vital Signs BP 124/66 (BP Location: Right Arm)   Pulse 68   Temp 98.7 F (37.1 C) (Oral)   Resp 16   SpO2 100%   Visual Acuity Right Eye Distance:   Left Eye Distance:   Bilateral Distance:    Right Eye Near:   Left Eye Near:    Bilateral Near:     Physical Exam Vitals and nursing note reviewed.  Constitutional:      Appearance: Normal appearance.  Genitourinary:    Comments:  Clear urethral discharge at the meatus.  No testicular swelling or pain.  No genital rashes. Neurological:     Mental Status: He is alert.      UC Treatments / Results  Labs (all labs ordered are listed, but only abnormal results are displayed) Labs Reviewed  CYTOLOGY, (ORAL, ANAL, URETHRAL) ANCILLARY ONLY    EKG   Radiology No results found.  Procedures Procedures (including critical care time)  Medications Ordered in UC Medications  cefTRIAXone (ROCEPHIN) injection 500 mg (500 mg Intramuscular Given 03/14/20 1115)    Initial Impression / Assessment and Plan / UC Course  I have reviewed the triage vital signs and the nursing notes.  Pertinent labs & imaging results that were available during my care of the patient were reviewed by me and considered in my medical decision making (see chart for details).     #Urethritis Patient is a 24 year old with urethritis symptoms.  Cytology swab sent.  Patient declined HIV and syphilis testing.  Will give Rocephin in clinic and doxycycline outpatient.  Safe sex discussed and instructions given.  Primary care  resource given.  Patient verbalized understanding the plan. Final Clinical Impressions(s) / UC Diagnoses   Final diagnoses:  Urethritis     Discharge Instructions     We have sent your labs and will notify you of any needed changes to treatment  Take the doxycycline 2 times a day for 7 days Abstain from sex until all results and medications complete  Establish with the Internal medicine center for Primary care      ED Prescriptions    Medication Sig Dispense Auth. Provider   doxycycline (VIBRAMYCIN) 100 MG capsule Take 1 capsule (100 mg total) by mouth 2 (two) times daily for 7 days. 14 capsule Alexia Dinger, Marguerita Beards, PA-C     PDMP not reviewed this encounter.   Purnell Shoemaker, PA-C 03/14/20 1222

## 2020-03-15 LAB — CYTOLOGY, (ORAL, ANAL, URETHRAL) ANCILLARY ONLY
Chlamydia: POSITIVE — AB
Comment: NEGATIVE
Comment: NORMAL
Neisseria Gonorrhea: NEGATIVE

## 2020-08-05 ENCOUNTER — Encounter (HOSPITAL_COMMUNITY): Payer: Self-pay

## 2020-08-05 ENCOUNTER — Ambulatory Visit (HOSPITAL_COMMUNITY)
Admission: EM | Admit: 2020-08-05 | Discharge: 2020-08-05 | Disposition: A | Discharge status: Home / Self Care | Payer: Self-pay | Attending: Urgent Care | Admitting: Urgent Care

## 2020-08-05 ENCOUNTER — Other Ambulatory Visit: Payer: Self-pay

## 2020-08-05 DIAGNOSIS — Z7251 High risk heterosexual behavior: Secondary | ICD-10-CM | POA: Insufficient documentation

## 2020-08-05 DIAGNOSIS — R369 Urethral discharge, unspecified: Secondary | ICD-10-CM | POA: Insufficient documentation

## 2020-08-05 DIAGNOSIS — A6001 Herpesviral infection of penis: Secondary | ICD-10-CM | POA: Insufficient documentation

## 2020-08-05 MED ORDER — LIDOCAINE HCL (PF) 1 % IJ SOLN
INTRAMUSCULAR | Status: AC
Start: 2020-08-05 — End: ?
  Filled 2020-08-05: qty 2

## 2020-08-05 MED ORDER — DOXYCYCLINE HYCLATE 100 MG PO CAPS: 100.0000 mg | ORAL_CAPSULE | Freq: Two times a day (BID) | ORAL | 0 refills | Status: DC

## 2020-08-05 MED ORDER — CEFTRIAXONE SODIUM 500 MG IJ SOLR
INTRAMUSCULAR | Status: AC
  Filled 2020-08-05: qty 500

## 2020-08-05 MED ORDER — ACYCLOVIR 400 MG PO TABS: ORAL_TABLET | ORAL | 5 refills | Status: DC

## 2020-08-05 MED ORDER — CEFTRIAXONE SODIUM 500 MG IJ SOLR
500.0000 mg | Freq: Once | INTRAMUSCULAR | Status: AC
  Administered 2020-08-05: 500 mg via INTRAMUSCULAR

## 2020-08-05 NOTE — ED Triage Notes (Signed)
Pt in with c/o penile discharge and tingling sensation that has been going on for about 1 week.   Denies hematuria, dysuria  Requesting STI testing

## 2020-08-05 NOTE — Discharge Instructions (Signed)
Avoid all forms of sexual intercourse (oral, vaginal, anal) for the next 7 days to avoid spreading/reinfecting. Return if symptoms worsen/do not resolve, you develop fever, abdominal pain, blood in your urine, or are re-exposed to an STI.  

## 2020-08-05 NOTE — ED Provider Notes (Signed)
  [facility]   MRN: [id number] DOB: [birth year]  Subjective:   Henry Lin is a 24 y.o. male presenting for 1 week history of penile discharge, tingling.  Patient is sexually active, does not use condoms for protection.  Has a history of genital herpes and is currently having an outbreak.  Patient has a significant history of chlamydial infections in the past on lab review. Denies dysuria, hematuria, urinary frequency, penile swelling, testicular pain, testicular swelling, anal pain, groin pain.  Also has difficulty with transportation.   Denies taking chronic medications.    No Known Allergies  Past Medical History:  Diagnosis Date  . Crohn's disease (HCC)   . Genital herpes      History reviewed. No pertinent surgical history.  Family History  Problem Relation Age of Onset  . Healthy Mother   . Healthy Father     Social History   Tobacco Use  . Smoking status: Current Some Day Smoker    Types: Cigars  . Smokeless tobacco: Never Used  Vaping Use  . Vaping Use: Never used  Substance Use Topics  . Alcohol use: No  . Drug use: No    ROS   Objective:   Vitals: BP 117/69 (BP Location: Right Arm)   Pulse 80   Resp 18   SpO2 97%   Physical Exam Constitutional:      General: He is not in acute distress.    Appearance: Normal appearance. He is well-developed and normal weight. He is not ill-appearing, toxic-appearing or diaphoretic.  HENT:     Head: Normocephalic and atraumatic.     Right Ear: External ear normal.     Left Ear: External ear normal.     Nose: Nose normal.     Mouth/Throat:     Pharynx: Oropharynx is clear.  Eyes:     General: No scleral icterus.       Right eye: No discharge.        Left eye: No discharge.     Extraocular Movements: Extraocular movements intact.     Pupils: Pupils are equal, round, and reactive to light.  Cardiovascular:     Rate and Rhythm: Normal rate.  Pulmonary:     Effort: Pulmonary effort is  normal.  Genitourinary:    Penis: Circumcised. Discharge and lesions (cluster of vesicular lesions) present. No phimosis, paraphimosis, hypospadias, erythema, tenderness or swelling.     Musculoskeletal:     Cervical back: Normal range of motion.  Neurological:     Mental Status: He is alert and oriented to person, place, and time.  Psychiatric:        Mood and Affect: Mood normal.        Behavior: Behavior normal.        Thought Content: Thought content normal.        Judgment: Judgment normal.     Assessment and Plan :   PDMP not reviewed this encounter.  1. Penile discharge   2. Unprotected sex     Patient treated empirically as per CDC guidelines with IM ceftriaxone, doxycycline as an outpatient.  Labs pending.   Counseled on safe sex practices including abstaining for 1 week following treatment. Start acyclovir for recurrent herpes outbreak. Counseled patient on potential for adverse effects with medications prescribed/recommended today, ER and return-to-clinic precautions discussed, patient verbalized understanding.    Wallis Bamberg, PA-C 08/05/20 1038

## 2020-08-06 LAB — CYTOLOGY, (ORAL, ANAL, URETHRAL) ANCILLARY ONLY
Chlamydia: NEGATIVE
Comment: NEGATIVE
Comment: NEGATIVE
Comment: NORMAL
Neisseria Gonorrhea: POSITIVE — AB
Trichomonas: NEGATIVE

## 2020-08-08 ENCOUNTER — Telehealth (HOSPITAL_COMMUNITY): Payer: Self-pay | Admitting: Emergency Medicine

## 2020-08-08 MED ORDER — ACYCLOVIR 400 MG PO TABS: ORAL_TABLET | ORAL | 5 refills | Status: DC

## 2020-08-08 NOTE — Telephone Encounter (Signed)
Patient wanted to change pharmacies to use GoodRx coupon

## 2020-11-07 ENCOUNTER — Ambulatory Visit (HOSPITAL_COMMUNITY)
Admission: EM | Admit: 2020-11-07 | Discharge: 2020-11-07 | Disposition: A | Discharge status: Home / Self Care | Payer: Self-pay | Attending: Family Medicine | Admitting: Family Medicine

## 2020-11-07 ENCOUNTER — Other Ambulatory Visit: Payer: Self-pay

## 2020-11-07 ENCOUNTER — Encounter (HOSPITAL_COMMUNITY): Payer: Self-pay

## 2020-11-07 DIAGNOSIS — A5601 Chlamydial cystitis and urethritis: Secondary | ICD-10-CM

## 2020-11-07 MED ORDER — AZITHROMYCIN 250 MG PO TABS
1000.0000 mg | ORAL_TABLET | Freq: Once | ORAL | Status: AC
  Administered 2020-11-07: 1000 mg via ORAL

## 2020-11-07 MED ORDER — AZITHROMYCIN 250 MG PO TABS
ORAL_TABLET | ORAL | Status: AC
  Filled 2020-11-07: qty 4

## 2020-11-07 MED ORDER — CEFTRIAXONE SODIUM 500 MG IJ SOLR
INTRAMUSCULAR | Status: AC
  Filled 2020-11-07: qty 500

## 2020-11-07 MED ORDER — CEFTRIAXONE SODIUM 500 MG IJ SOLR
500.0000 mg | Freq: Once | INTRAMUSCULAR | Status: AC
  Administered 2020-11-07: 500 mg via INTRAMUSCULAR

## 2020-11-07 MED ORDER — LIDOCAINE HCL (PF) 1 % IJ SOLN
INTRAMUSCULAR | Status: AC
  Filled 2020-11-07: qty 2

## 2020-11-07 NOTE — Discharge Instructions (Signed)
We will call with any positive tests  Please abstain from intercourse until symptom resolution.  Please follow up if your symptoms fail to improve.

## 2020-11-07 NOTE — ED Triage Notes (Signed)
Pt states that his partner from 09/21/20 informed him today that she was positive for chlamydia and gonorrhea. Pt states he has not had sexual relations with partner since 01/01.  Pt reports mild burning with urination for past two days.   Denies penile discharge, fever, n/v/d.

## 2020-11-07 NOTE — ED Provider Notes (Signed)
[facility]    CSN: [id number] Arrival date & time: 11/07/20  3710      History   Chief Complaint Chief Complaint  Patient presents with  . SEXUALLY TRANSMITTED DISEASE    HPI Tavone ATTILA MCCARTHY is a 25 y.o. male.   He is presenting with a few day history of urethral discharge.  He does have recent contact with someone that had a positive chlamydia test.  HPI  Past Medical History:  Diagnosis Date  . Crohn's disease (HCC)   . Genital herpes     There are no problems to display for this patient.   History reviewed. No pertinent surgical history.     Home Medications    Prior to Admission medications   Medication Sig Start Date End Date Taking? Authorizing Provider  acyclovir (ZOVIRAX) 400 MG tablet At the start of an outbreak, take one tablet 3 times daily for 5 days. 08/08/20   Lamptey, Britta Mccreedy, MD  doxycycline (VIBRAMYCIN) 100 MG capsule Take 1 capsule (100 mg total) by mouth 2 (two) times daily. 08/05/20   Wallis Bamberg, PA-C  ibuprofen (ADVIL) 800 MG tablet Take 1 tablet (800 mg total) by mouth 3 (three) times daily. 10/19/19   Eustace Moore, MD  triamcinolone cream (KENALOG) 0.1 % Apply 1 application topically 3 (three) times daily as needed. 09/23/19   Bing Neighbors, FNP    Family History Family History  Problem Relation Age of Onset  . Healthy Mother   . Healthy Father     Social History Social History   Tobacco Use  . Smoking status: Current Some Day Smoker    Types: Cigars  . Smokeless tobacco: Never Used  Vaping Use  . Vaping Use: Never used  Substance Use Topics  . Alcohol use: No  . Drug use: No     Allergies   Patient has no known allergies.   Review of Systems Review of Systems  See HPI  Physical Exam Triage Vital Signs ED Triage Vitals  Enc Vitals Group     BP 11/07/20 1916 115/76     Pulse Rate 11/07/20 1916 81     Resp 11/07/20 1916 18     Temp 11/07/20 1916 98.4 F (36.9 C)     Temp Source 11/07/20  1916 Oral     SpO2 11/07/20 1916 99 %     Weight --      Height --      Head Circumference --      Peak Flow --      Pain Score 11/07/20 1911 0     Pain Loc --      Pain Edu? --      Excl. in GC? --    No data found.  Updated Vital Signs BP 115/76 (BP Location: Left Arm)   Pulse 81   Temp 98.4 F (36.9 C) (Oral)   Resp 18   SpO2 99%   Visual Acuity Right Eye Distance:   Left Eye Distance:   Bilateral Distance:    Right Eye Near:   Left Eye Near:    Bilateral Near:     Physical Exam Gen: NAD, alert, cooperative with exam, well-appearing ENT: normal lips, normal nasal mucosa,  Eye: normal EOM, normal conjunctiva and lids Skin: no rashes, no areas of induration  Neuro: normal tone, normal sensation to touch Psych:  normal insight, alert and oriented MSK:  GU: Discharge appreciated   UC Treatments / Results  Labs (all  labs ordered are listed, but only abnormal results are displayed) Labs Reviewed  CYTOLOGY, (ORAL, ANAL, URETHRAL) ANCILLARY ONLY    EKG   Radiology No results found.  Procedures Procedures (including critical care time)  Medications Ordered in UC Medications  cefTRIAXone (ROCEPHIN) injection 500 mg (500 mg Intramuscular Given 11/07/20 1945)  azithromycin (ZITHROMAX) tablet 1,000 mg (1,000 mg Oral Given 11/07/20 1945)    Initial Impression / Assessment and Plan / UC Course  I have reviewed the triage vital signs and the nursing notes.  Pertinent labs & imaging results that were available during my care of the patient were reviewed by me and considered in my medical decision making (see chart for details).     Mr. Lamb was a 25 year old male that is presenting with urethral discharge likely secondary to chlamydia infection.  He does have contact exposure.  Provided with treatment for gonorrhea and chlamydia.  Cytology swab was obtained.  Counseled supportive care.  Given indications on follow-up.  Final Clinical Impressions(s) / UC  Diagnoses   Final diagnoses:  Chlamydial urethritis     Discharge Instructions     We will call with any positive tests  Please abstain from intercourse until symptom resolution.  Please follow up if your symptoms fail to improve.     ED Prescriptions    None     PDMP not reviewed this encounter.   Myra Rude, MD 11/07/20 [phone]

## 2020-11-08 LAB — CYTOLOGY, (ORAL, ANAL, URETHRAL) ANCILLARY ONLY
Chlamydia: NEGATIVE
Comment: NEGATIVE
Comment: NEGATIVE
Comment: NORMAL
Neisseria Gonorrhea: POSITIVE — AB
Trichomonas: NEGATIVE

## 2020-12-31 ENCOUNTER — Other Ambulatory Visit: Payer: Self-pay

## 2020-12-31 ENCOUNTER — Encounter (HOSPITAL_COMMUNITY): Payer: Self-pay | Admitting: Emergency Medicine

## 2020-12-31 ENCOUNTER — Ambulatory Visit (HOSPITAL_COMMUNITY)
Admission: EM | Admit: 2020-12-31 | Discharge: 2020-12-31 | Disposition: A | Discharge status: Home / Self Care | Payer: Self-pay | Attending: Emergency Medicine | Admitting: Emergency Medicine

## 2020-12-31 DIAGNOSIS — R369 Urethral discharge, unspecified: Secondary | ICD-10-CM | POA: Insufficient documentation

## 2020-12-31 LAB — POCT URINALYSIS DIPSTICK, ED / UC
Bilirubin Urine: NEGATIVE
Glucose, UA: NEGATIVE mg/dL
Ketones, ur: NEGATIVE mg/dL
Nitrite: NEGATIVE
Protein, ur: NEGATIVE mg/dL
Specific Gravity, Urine: 1.015 (ref 1.005–1.030)
Urobilinogen, UA: 0.2 mg/dL (ref 0.0–1.0)
pH: 8.5 — ABNORMAL HIGH (ref 5.0–8.0)

## 2020-12-31 NOTE — Discharge Instructions (Addendum)
Labs pending 2-3 days, will be called if any result positive   Do not have sex until labs result, if positive do not have sex until treatment complete please notify partner if positive

## 2020-12-31 NOTE — ED Triage Notes (Signed)
Pt presents with discharge and dysuria xs 2 weeks. Would like STD testing.

## 2020-12-31 NOTE — ED Provider Notes (Signed)
[facility]    CSN: [id number] Arrival date & time: 12/31/20  1020      History   Chief Complaint Chief Complaint  Patient presents with  . SEXUALLY TRANSMITTED DISEASE    HPI Henry Lin is a 25 y.o. male.   Patient presents with dysuria and Dequon Schnebly discharge for 2 weeks. Amount of discharge has increased over the last few days. Endorses urinary frequency and urgency as well. Denies itching, swelling, abdominal pain, flank pain, fever, chils. One partner, using condoms with every sexual encounter.   Past Medical History:  Diagnosis Date  . Crohn's disease (HCC)   . Genital herpes     There are no problems to display for this patient.   History reviewed. No pertinent surgical history.     Home Medications    Prior to Admission medications   Medication Sig Start Date End Date Taking? Authorizing Provider  acyclovir (ZOVIRAX) 400 MG tablet At the start of an outbreak, take one tablet 3 times daily for 5 days. 08/08/20   Lamptey, Britta Mccreedy, MD  doxycycline (VIBRAMYCIN) 100 MG capsule Take 1 capsule (100 mg total) by mouth 2 (two) times daily. 08/05/20   Wallis Bamberg, PA-C  ibuprofen (ADVIL) 800 MG tablet Take 1 tablet (800 mg total) by mouth 3 (three) times daily. 10/19/19   Eustace Moore, MD  triamcinolone cream (KENALOG) 0.1 % Apply 1 application topically 3 (three) times daily as needed. 09/23/19   Bing Neighbors, FNP    Family History Family History  Problem Relation Age of Onset  . Healthy Mother   . Healthy Father     Social History Social History   Tobacco Use  . Smoking status: Current Some Day Smoker    Types: Cigars  . Smokeless tobacco: Never Used  Vaping Use  . Vaping Use: Never used  Substance Use Topics  . Alcohol use: No  . Drug use: No     Allergies   Patient has no known allergies.   Review of Systems Review of Systems  Constitutional: Negative.   HENT: Negative.   Respiratory: Negative.   Cardiovascular:  Negative.   Gastrointestinal: Negative.   Genitourinary: Positive for decreased urine volume, dysuria, frequency, penile discharge and urgency. Negative for difficulty urinating, enuresis, flank pain, genital sores, hematuria, penile pain, penile swelling, scrotal swelling and testicular pain.     Physical Exam Triage Vital Signs ED Triage Vitals  Enc Vitals Group     BP 12/31/20 1103 119/66     Pulse Rate 12/31/20 1103 61     Resp 12/31/20 1103 15     Temp 12/31/20 1103 97.9 F (36.6 C)     Temp Source 12/31/20 1103 Oral     SpO2 12/31/20 1103 99 %     Weight --      Height --      Head Circumference --      Peak Flow --      Pain Score 12/31/20 1102 0     Pain Loc --      Pain Edu? --      Excl. in GC? --    No data found.  Updated Vital Signs BP 119/66 (BP Location: Right Arm)   Pulse 61   Temp 97.9 F (36.6 C) (Oral)   Resp 15   SpO2 99%   Visual Acuity Right Eye Distance:   Left Eye Distance:   Bilateral Distance:    Right Eye Near:   Left  Eye Near:    Bilateral Near:     Physical Exam Constitutional:      Appearance: Normal appearance. He is normal weight.  HENT:     Head: Normocephalic.  Pulmonary:     Effort: Pulmonary effort is normal.  Abdominal:     General: Abdomen is flat. Bowel sounds are normal.     Palpations: Abdomen is soft.     Tenderness: There is no right CVA tenderness or left CVA tenderness.  Musculoskeletal:        General: Normal range of motion.  Skin:    General: Skin is warm and dry.  Neurological:     Mental Status: He is alert and oriented to person, place, and time. Mental status is at baseline.  Psychiatric:        Mood and Affect: Mood normal.        Behavior: Behavior normal.        Thought Content: Thought content normal.        Judgment: Judgment normal.      UC Treatments / Results  Labs (all labs ordered are listed, but only abnormal results are displayed) Labs Reviewed  POCT URINALYSIS DIPSTICK, ED / UC  - Abnormal; Notable for the following components:      Result Value   Hgb urine dipstick TRACE (*)    pH 8.5 (*)    Leukocytes,Ua MODERATE (*)    All other components within normal limits  URINE CULTURE  CYTOLOGY, (ORAL, ANAL, URETHRAL) ANCILLARY ONLY    EKG   Radiology No results found.  Procedures Procedures (including critical care time)  Medications Ordered in UC Medications - No data to display  Initial Impression / Assessment and Plan / UC Course  I have reviewed the triage vital signs and the nursing notes.  Pertinent labs & imaging results that were available during my care of the patient were reviewed by me and considered in my medical decision making (see chart for details).  Penile Discharge  1. Urinalysis- + leukocytes, trace hgb- sent for culture 2.  Urethral swab- pending 2-3 days, will treat per protocol, advised abstinence until labs result and/or treatment complete  Final Clinical Impressions(s) / UC Diagnoses   Final diagnoses:  Penile discharge     Discharge Instructions     Labs pending 2-3 days, will be called if any result positive   Do not have sex until labs result, if positive do not have sex until treatment complete please notify partner if positive     ED Prescriptions    None     PDMP not reviewed this encounter.   Valinda Hoar, NP 12/31/20 1224

## 2021-01-01 ENCOUNTER — Telehealth (HOSPITAL_COMMUNITY): Payer: Self-pay | Admitting: Emergency Medicine

## 2021-01-01 ENCOUNTER — Ambulatory Visit (HOSPITAL_COMMUNITY)
Admission: EM | Admit: 2021-01-01 | Discharge: 2021-01-01 | Disposition: A | Discharge status: Home / Self Care | Payer: Self-pay | Attending: Family Medicine | Admitting: Family Medicine

## 2021-01-01 ENCOUNTER — Other Ambulatory Visit: Payer: Self-pay

## 2021-01-01 DIAGNOSIS — A549 Gonococcal infection, unspecified: Secondary | ICD-10-CM

## 2021-01-01 LAB — CYTOLOGY, (ORAL, ANAL, URETHRAL) ANCILLARY ONLY
Chlamydia: POSITIVE — AB
Comment: NEGATIVE
Comment: NEGATIVE
Comment: NORMAL
Neisseria Gonorrhea: POSITIVE — AB
Trichomonas: NEGATIVE

## 2021-01-01 LAB — URINE CULTURE: Culture: NO GROWTH

## 2021-01-01 MED ORDER — DOXYCYCLINE HYCLATE 100 MG PO CAPS: 100.0000 mg | ORAL_CAPSULE | Freq: Two times a day (BID) | ORAL | 0 refills | Status: AC

## 2021-01-01 MED ORDER — CEFTRIAXONE SODIUM 500 MG IJ SOLR
INTRAMUSCULAR | Status: AC
  Filled 2021-01-01: qty 500

## 2021-01-01 MED ORDER — CEFTRIAXONE SODIUM 500 MG IJ SOLR
500.0000 mg | Freq: Once | INTRAMUSCULAR | Status: AC
  Administered 2021-01-01: 500 mg via INTRAMUSCULAR

## 2021-01-01 MED ORDER — LIDOCAINE HCL (PF) 1 % IJ SOLN
INTRAMUSCULAR | Status: AC
  Filled 2021-01-01: qty 2

## 2021-01-01 NOTE — ED Triage Notes (Signed)
Pt is here today for tx. Pt is positive for Chlamydia and Gonorrhea.

## 2021-01-01 NOTE — Telephone Encounter (Signed)
Per protocol, patient will need treatment with 500mg  IM Rocephin and doxycycline for positive Gonorrhea and Chlamydia.

## 2021-01-18 ENCOUNTER — Other Ambulatory Visit: Payer: Self-pay

## 2021-01-18 ENCOUNTER — Emergency Department (HOSPITAL_COMMUNITY)
Admission: EM | Admit: 2021-01-18 | Discharge: 2021-01-18 | Disposition: A | Discharge status: Home / Self Care | Payer: Self-pay | Attending: Emergency Medicine | Admitting: Emergency Medicine

## 2021-01-18 ENCOUNTER — Encounter (HOSPITAL_COMMUNITY): Payer: Self-pay | Admitting: Emergency Medicine

## 2021-01-18 DIAGNOSIS — X58XXXA Exposure to other specified factors, initial encounter: Secondary | ICD-10-CM | POA: Insufficient documentation

## 2021-01-18 DIAGNOSIS — S39012A Strain of muscle, fascia and tendon of lower back, initial encounter: Secondary | ICD-10-CM | POA: Insufficient documentation

## 2021-01-18 DIAGNOSIS — F17290 Nicotine dependence, other tobacco product, uncomplicated: Secondary | ICD-10-CM | POA: Insufficient documentation

## 2021-01-18 MED ORDER — CYCLOBENZAPRINE HCL 10 MG PO TABS: 10.0000 mg | ORAL_TABLET | Freq: Two times a day (BID) | ORAL | 0 refills | Status: DC | PRN

## 2021-01-18 MED ORDER — IBUPROFEN 600 MG PO TABS
600.0000 mg | ORAL_TABLET | Freq: Four times a day (QID) | ORAL | 0 refills | Status: DC | PRN
Start: 2021-01-18 — End: 2022-05-07

## 2021-01-18 NOTE — ED Provider Notes (Signed)
[facility] EMERGENCY DEPARTMENT Provider Note   CSN: [id number] Arrival date & time: 01/18/21  0846     History Chief Complaint  Patient presents with  . Back Pain    Henry Lin is a 25 y.o. male.  Patient is a 25 year old male with a history of Crohn's disease who presents with back pain.  He states that last night he started having some what he calls back spasms across his lower back.  It got worse this morning.  He feels like it is muscle spasms.  There is no radiation down his legs.  No associate abdominal pain.  No numbness or weakness to his legs.  No fevers.  No urinary symptoms.  No bowel or bladder incontinence.  No history of recent trauma or known injuries.  He has not taking anything for the pain.  He states he had a similar episode in the past that resolved.        Past Medical History:  Diagnosis Date  . Crohn's disease (HCC)   . Genital herpes     There are no problems to display for this patient.   History reviewed. No pertinent surgical history.     Family History  Problem Relation Age of Onset  . Healthy Mother   . Healthy Father     Social History   Tobacco Use  . Smoking status: Current Some Day Smoker    Types: Cigars  . Smokeless tobacco: Never Used  Vaping Use  . Vaping Use: Never used  Substance Use Topics  . Alcohol use: No  . Drug use: No    Home Medications Prior to Admission medications   Medication Sig Start Date End Date Taking? Authorizing Provider  cyclobenzaprine (FLEXERIL) 10 MG tablet Take 1 tablet (10 mg total) by mouth 2 (two) times daily as needed for muscle spasms. 01/18/21  Yes Rolan Bucco, MD  ibuprofen (ADVIL) 600 MG tablet Take 1 tablet (600 mg total) by mouth every 6 (six) hours as needed. 01/18/21  Yes Rolan Bucco, MD  acyclovir (ZOVIRAX) 400 MG tablet At the start of an outbreak, take one tablet 3 times daily for 5 days. 08/08/20   LampteyBritta Mccreedy, MD  triamcinolone cream  (KENALOG) 0.1 % Apply 1 application topically 3 (three) times daily as needed. 09/23/19   Bing Neighbors, FNP    Allergies    Patient has no known allergies.  Review of Systems   Review of Systems  Constitutional: Negative for fever.  Gastrointestinal: Negative for nausea and vomiting.  Musculoskeletal: Positive for back pain. Negative for arthralgias, joint swelling and neck pain.  Skin: Negative for wound.  Neurological: Negative for weakness, numbness and headaches.    Physical Exam Updated Vital Signs BP 113/63 (BP Location: Right Arm)   Pulse 87   Temp 99.1 F (37.3 C) (Oral)   Resp 20   SpO2 99%   Physical Exam Constitutional:      Appearance: He is well-developed.  HENT:     Head: Normocephalic and atraumatic.  Cardiovascular:     Rate and Rhythm: Normal rate.  Pulmonary:     Effort: Pulmonary effort is normal.  Musculoskeletal:        General: Tenderness present.     Cervical back: Normal range of motion and neck supple.     Comments: There is some tenderness across the musculature of the lower lumbar region bilaterally.  No midline tenderness, negative straight leg raise, motor 5 out of 5  to lower extremities bilaterally, sensation grossly intact to light touch bilaterally, pedal pulses are intact, patellar reflexes symmetric  Skin:    General: Skin is warm and dry.  Neurological:     Mental Status: He is alert and oriented to person, place, and time.     ED Results / Procedures / Treatments   Labs (all labs ordered are listed, but only abnormal results are displayed) Labs Reviewed - No data to display  EKG None  Radiology No results found.  Procedures Procedures   Medications Ordered in ED Medications - No data to display  ED Course  I have reviewed the triage vital signs and the nursing notes.  Pertinent labs & imaging results that were available during my care of the patient were reviewed by me and considered in my medical decision making  (see chart for details).    MDM Rules/Calculators/A&P                          Patient presents with low back pain.  There is no radicular symptoms.  No neurologic deficits.  No fevers or associated abdominal pain.  This is likely muscular in nature.  He was discharged home in good condition.  He was given a prescription for ibuprofen and Flexeril.  He was given a referral to follow-up with orthopedics if his symptoms are not improving.  Return precautions were given. Final Clinical Impression(s) / ED Diagnoses Final diagnoses:  Strain of lumbar region, initial encounter    Rx / DC Orders ED Discharge Orders         Ordered    ibuprofen (ADVIL) 600 MG tablet  Every 6 hours PRN        01/18/21 0915    cyclobenzaprine (FLEXERIL) 10 MG tablet  2 times daily PRN        01/18/21 0915           Rolan Bucco, MD 01/18/21 [phone]

## 2021-01-18 NOTE — ED Triage Notes (Signed)
C/o back pain/"spasms" that started last night when he went to bed.  No known injury.  Pain does not radiate down legs.  Denies urinary complaints.

## 2021-05-29 IMAGING — DX DG RIBS W/ CHEST 3+V*L*
4 series · 4 of 4 positions shown · non-contrast
Comparison: None.

CLINICAL DATA: Injury while boxing

EXAM:
LEFT RIBS AND CHEST - 3+ VIEW

[chest pa]
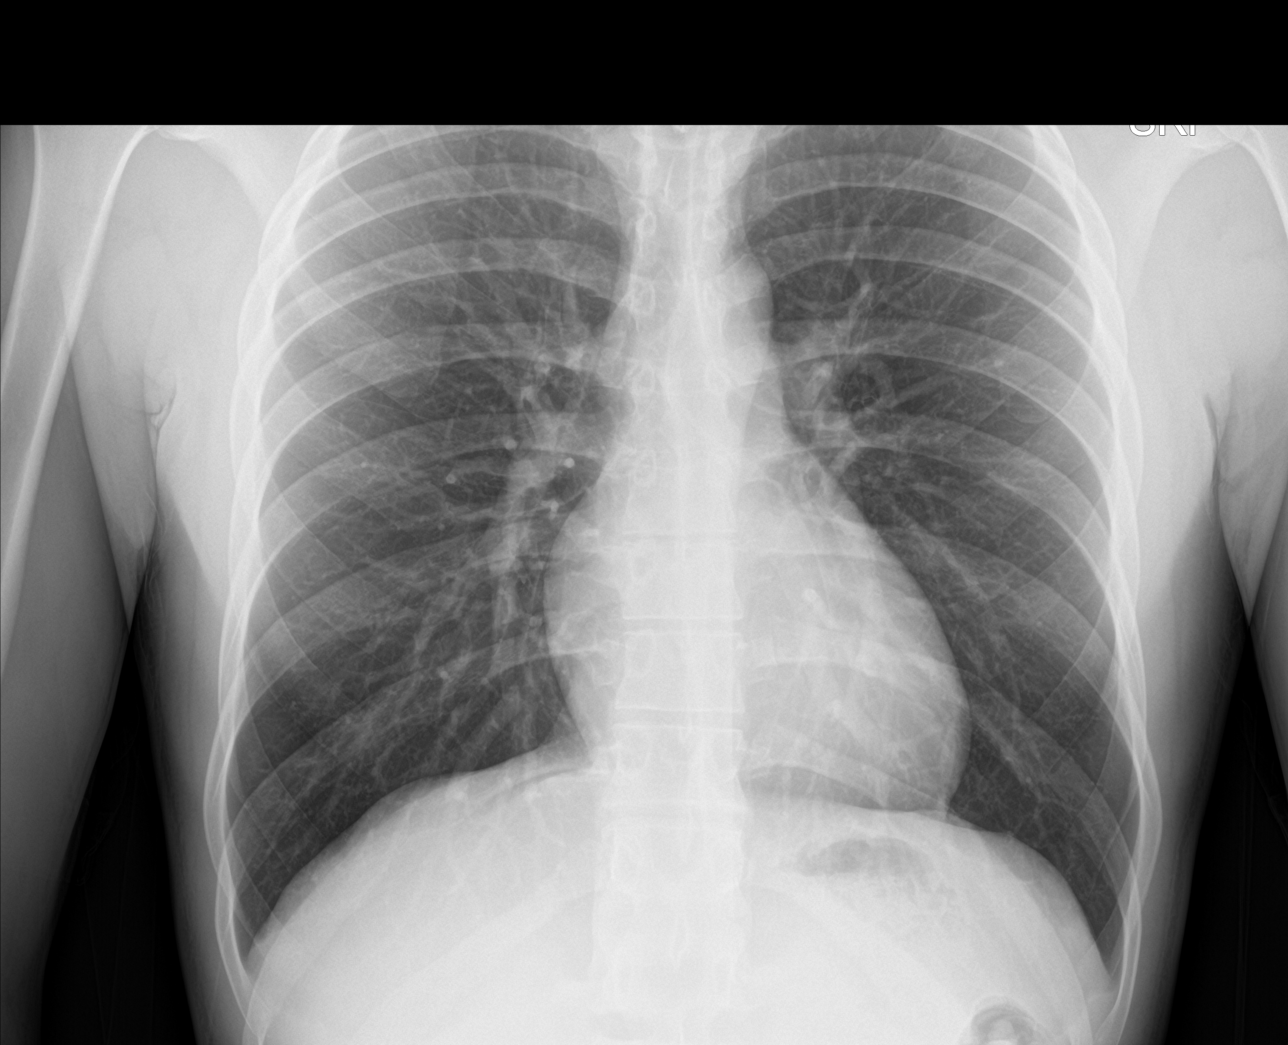

[rib obl (1 of 2)]
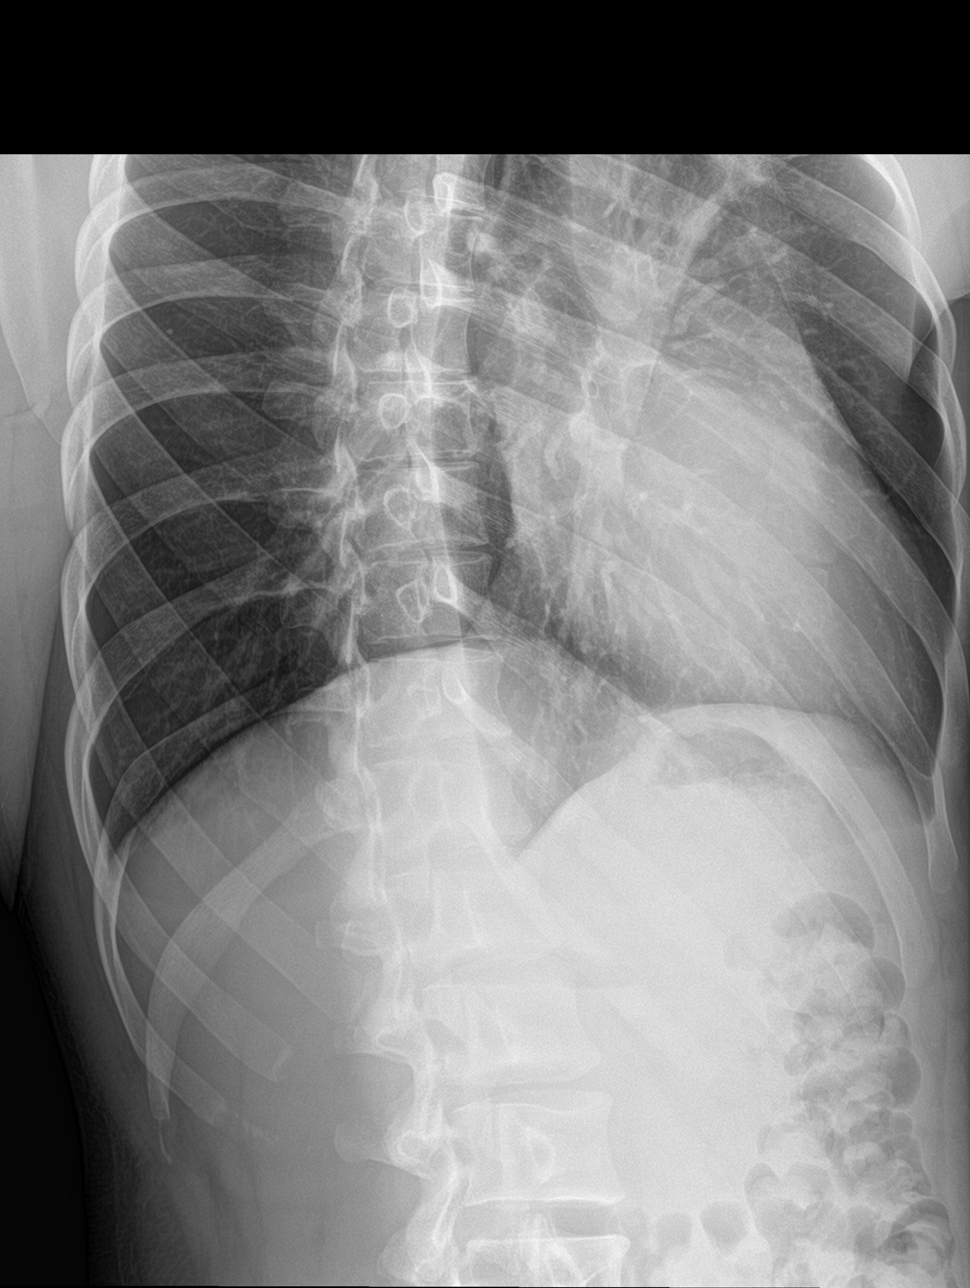

[rib obl (2 of 2)]
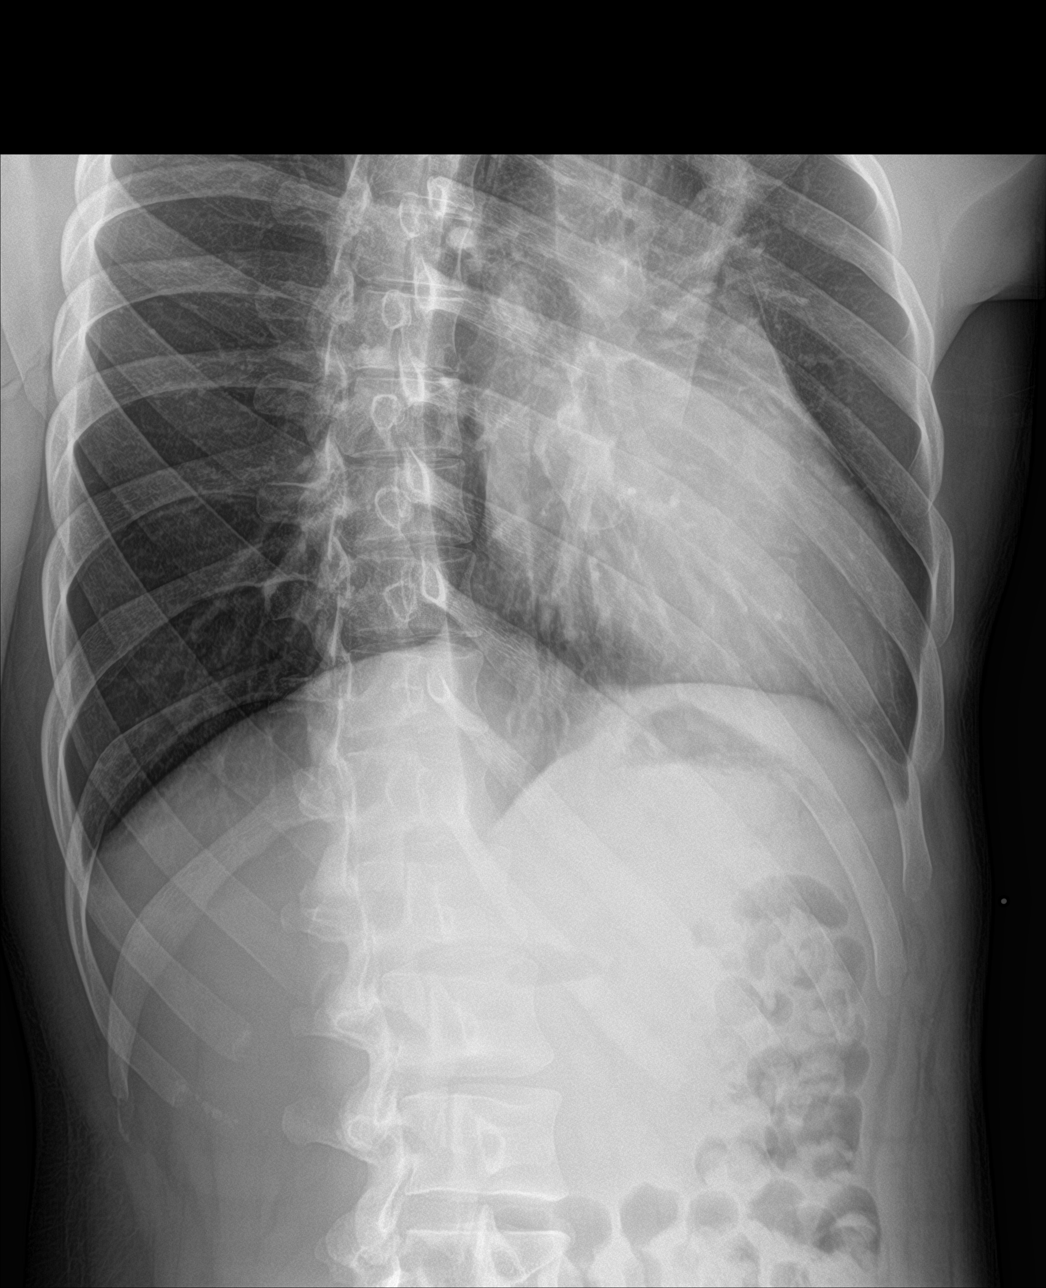

[rib pa]
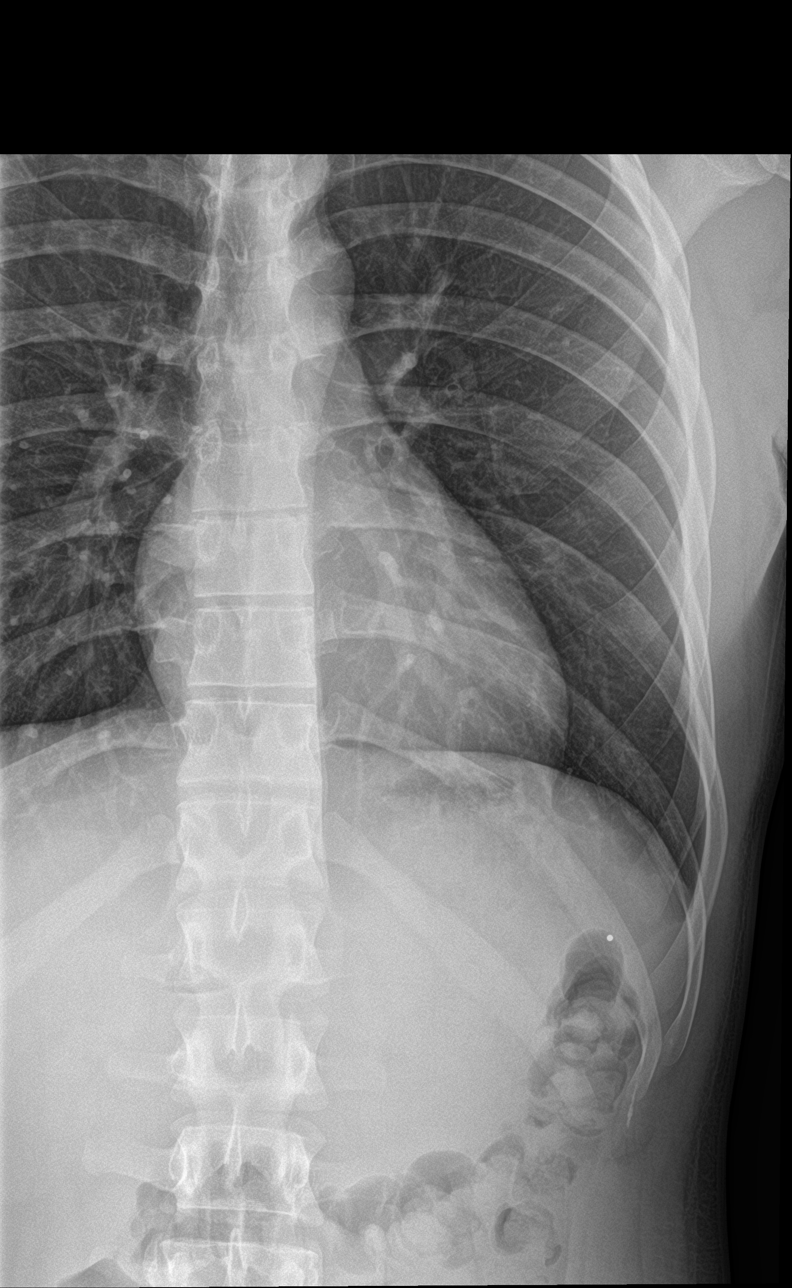

[4 of 4 positions shown; findings below may reference images not displayed]

FINDINGS: Frontal chest as well as oblique and cone-down rib images obtained.
Lungs are clear. Heart size and pulmonary vascularity are normal. No
adenopathy. No pneumothorax or pleural effusion. No evident rib
fracture.
IMPRESSION: Lungs clear.  No evident rib fracture.

## 2021-06-11 ENCOUNTER — Other Ambulatory Visit: Payer: Self-pay

## 2021-06-11 ENCOUNTER — Ambulatory Visit (HOSPITAL_COMMUNITY)
Admission: EM | Admit: 2021-06-11 | Discharge: 2021-06-11 | Disposition: A | Discharge status: Home / Self Care | Payer: Self-pay | Attending: Internal Medicine | Admitting: Internal Medicine

## 2021-06-11 ENCOUNTER — Encounter (HOSPITAL_COMMUNITY): Payer: Self-pay

## 2021-06-11 ENCOUNTER — Telehealth (HOSPITAL_COMMUNITY): Payer: Self-pay | Admitting: Emergency Medicine

## 2021-06-11 DIAGNOSIS — Z113 Encounter for screening for infections with a predominantly sexual mode of transmission: Secondary | ICD-10-CM | POA: Insufficient documentation

## 2021-06-11 DIAGNOSIS — Z202 Contact with and (suspected) exposure to infections with a predominantly sexual mode of transmission: Secondary | ICD-10-CM | POA: Insufficient documentation

## 2021-06-11 DIAGNOSIS — B350 Tinea barbae and tinea capitis: Secondary | ICD-10-CM | POA: Insufficient documentation

## 2021-06-11 DIAGNOSIS — R369 Urethral discharge, unspecified: Secondary | ICD-10-CM

## 2021-06-11 DIAGNOSIS — R300 Dysuria: Secondary | ICD-10-CM

## 2021-06-11 LAB — POCT URINALYSIS DIPSTICK, ED / UC
Bilirubin Urine: NEGATIVE
Glucose, UA: NEGATIVE mg/dL
Hgb urine dipstick: NEGATIVE
Ketones, ur: NEGATIVE mg/dL
Leukocytes,Ua: NEGATIVE
Nitrite: NEGATIVE
Protein, ur: NEGATIVE mg/dL
Specific Gravity, Urine: 1.025 (ref 1.005–1.030)
Urobilinogen, UA: 0.2 mg/dL (ref 0.0–1.0)
pH: 7 (ref 5.0–8.0)

## 2021-06-11 LAB — HIV ANTIBODY (ROUTINE TESTING W REFLEX): HIV Screen 4th Generation wRfx: NONREACTIVE

## 2021-06-11 MED ORDER — FLUCONAZOLE 150 MG PO TABS: ORAL_TABLET | ORAL | 0 refills | Status: DC

## 2021-06-11 MED ORDER — DOXYCYCLINE HYCLATE 100 MG PO CAPS: 100.0000 mg | ORAL_CAPSULE | Freq: Two times a day (BID) | ORAL | 0 refills | Status: DC

## 2021-06-11 MED ORDER — DOXYCYCLINE HYCLATE 100 MG PO CAPS: 100.0000 mg | ORAL_CAPSULE | Freq: Two times a day (BID) | ORAL | 0 refills | Status: AC

## 2021-06-11 NOTE — Telephone Encounter (Signed)
Patient wanted prescriptions sent to new pharmacy to use GoodRx

## 2021-06-11 NOTE — ED Triage Notes (Signed)
Pt states had a positive exposure to chlamydia. C/o burning on urination with clear discharge at times x3wks.

## 2021-06-11 NOTE — Discharge Instructions (Signed)
Take the Diflucan one pill a week for 4 weeks to help with your scalp.   We will contact you if the results from your lab work are positive and require additional treatment.    Take the doxycycline twice a day for 7 days.  Take all of the pills.    Do not have sex while taking undergoing treatment for STI.  Make sure that all of your partners get tested and treated.   Use a condom or other barrier method for all sexual encounters.    Return or go to the Emergency Department if symptoms worsen or do not improve in the next few days.

## 2021-06-11 NOTE — ED Provider Notes (Signed)
[facility]    CSN: [id number] Arrival date & time: 06/11/21  3329      History   Chief Complaint Chief Complaint  Patient presents with   SEXUALLY TRANSMITTED DISEASE    HPI Henry Lin is a 25 y.o. male.   Patient here for evaluation of dysuria and clear discharge that has been ongoing for the past 3 weeks.  Patient reports sexual partner recently tested positive for chlamydia.  Denies any urinary urgency or frequency.  Denies any penile pain or swelling.  Also reports 2 areas of itching and irritation to his scalp.  Reports 1 area has been there for several years and recently developed the secondary.  Has not tried any OTC medications or treatments.  Denies any trauma, injury, or other precipitating event.  Denies any specific alleviating or aggravating factors.  Denies any fevers, chest pain, shortness of breath, N/V/D, numbness, tingling, weakness, abdominal pain, or headaches.    The history is provided by the patient.   Past Medical History:  Diagnosis Date   Crohn's disease (HCC)    Genital herpes     There are no problems to display for this patient.   History reviewed. No pertinent surgical history.     Home Medications    Prior to Admission medications   Medication Sig Start Date End Date Taking? Authorizing Provider  doxycycline (VIBRAMYCIN) 100 MG capsule Take 1 capsule (100 mg total) by mouth 2 (two) times daily for 7 days. 06/11/21 06/18/21 Yes Ivette Loyal, NP  fluconazole (DIFLUCAN) 150 MG tablet Take 1 tablet a week for 4 weeks 06/11/21  Yes Ivette Loyal, NP  acyclovir (ZOVIRAX) 400 MG tablet At the start of an outbreak, take one tablet 3 times daily for 5 days. 08/08/20   Lamptey, Britta Mccreedy, MD  cyclobenzaprine (FLEXERIL) 10 MG tablet Take 1 tablet (10 mg total) by mouth 2 (two) times daily as needed for muscle spasms. 01/18/21   Rolan Bucco, MD  ibuprofen (ADVIL) 600 MG tablet Take 1 tablet (600 mg total) by mouth every 6 (six)  hours as needed. 01/18/21   Rolan Bucco, MD  triamcinolone cream (KENALOG) 0.1 % Apply 1 application topically 3 (three) times daily as needed. 09/23/19   Bing Neighbors, FNP    Family History Family History  Problem Relation Age of Onset   Healthy Mother    Healthy Father     Social History Social History   Tobacco Use   Smoking status: Some Days    Types: Cigars   Smokeless tobacco: Never  Vaping Use   Vaping Use: Never used  Substance Use Topics   Alcohol use: No   Drug use: No     Allergies   Patient has no known allergies.   Review of Systems Review of Systems  Genitourinary:  Positive for dysuria and penile discharge. Negative for frequency, penile pain, penile swelling, scrotal swelling, testicular pain and urgency.  Skin:  Positive for rash.  All other systems reviewed and are negative.   Physical Exam Triage Vital Signs ED Triage Vitals [06/11/21 0841]  Enc Vitals Group     BP 125/66     Pulse Rate 72     Resp 16     Temp 97.9 F (36.6 C)     Temp Source Oral     SpO2 97 %     Weight      Height      Head Circumference  Peak Flow      Pain Score 0     Pain Loc      Pain Edu?      Excl. in [place]?    No data found.  Updated Vital Signs BP 125/66 (BP Location: Right Arm)   Pulse 72   Temp 97.9 F (36.6 C) (Oral)   Resp 16   SpO2 97%   Visual Acuity Right Eye Distance:   Left Eye Distance:   Bilateral Distance:    Right Eye Near:   Left Eye Near:    Bilateral Near:     Physical Exam Vitals and nursing note reviewed.  Constitutional:      General: He is not in acute distress.    Appearance: Normal appearance. He is not ill-appearing, toxic-appearing or diaphoretic.  HENT:     Head: Normocephalic and atraumatic.  Eyes:     Conjunctiva/sclera: Conjunctivae normal.  Cardiovascular:     Rate and Rhythm: Normal rate.     Pulses: Normal pulses.  Pulmonary:     Effort: Pulmonary effort is normal.  Abdominal:     General:  Abdomen is flat.  Genitourinary:    Comments: declines Musculoskeletal:        General: Normal range of motion.     Cervical back: Normal range of motion.  Skin:    General: Skin is warm and dry.     Findings: Rash present. Rash is scaling (two areas of scaling and scabbing to top of scalp).  Neurological:     General: No focal deficit present.     Mental Status: He is alert and oriented to person, place, and time.  Psychiatric:        Mood and Affect: Mood normal.     UC Treatments / Results  Labs (all labs ordered are listed, but only abnormal results are displayed) Labs Reviewed  HIV ANTIBODY (ROUTINE TESTING W REFLEX)  RPR  POCT URINALYSIS DIPSTICK, ED / UC  CYTOLOGY, (ORAL, ANAL, URETHRAL) ANCILLARY ONLY    EKG   Radiology No results found.  Procedures Procedures (including critical care time)  Medications Ordered in UC Medications - No data to display  Initial Impression / Assessment and Plan / UC Course  I have reviewed the triage vital signs and the nursing notes.  Pertinent labs & imaging results that were available during my care of the patient were reviewed by me and considered in my medical decision making (see chart for details).    Exposure to chlamydia, STD screen Urinalysis negative for any signs of infection Self swab obtained As patient has a known exposure we will go ahead and treat for chlamydia with doxycycline Discussed safe sex practices including condoms or other barrier method use Instructed to abstain from sex while undergoing treatment  Tinea capitis Diflucan 1 pill a week for the next 4 weeks  Follow-up as needed Final Clinical Impressions(s) / UC Diagnoses   Final diagnoses:  Exposure to chlamydia  Screen for STD (sexually transmitted disease)  Tinea capitis     Discharge Instructions      Take the Diflucan one pill a week for 4 weeks to help with your scalp.   We will contact you if the results from your lab work are  positive and require additional treatment.    Take the doxycycline twice a day for 7 days.  Take all of the pills.    Do not have sex while taking undergoing treatment for STI.  Make sure that all  of your partners get tested and treated.   Use a condom or other barrier method for all sexual encounters.    Return or go to the Emergency Department if symptoms worsen or do not improve in the next few days.       ED Prescriptions     Medication Sig Dispense Auth. Provider   fluconazole (DIFLUCAN) 150 MG tablet Take 1 tablet a week for 4 weeks 4 tablet Ivette Loyal, NP   doxycycline (VIBRAMYCIN) 100 MG capsule Take 1 capsule (100 mg total) by mouth 2 (two) times daily for 7 days. 14 capsule Ivette Loyal, NP      PDMP not reviewed this encounter.   Ivette Loyal, NP 06/11/21 [phone]

## 2021-06-12 LAB — RPR: RPR Ser Ql: NONREACTIVE

## 2021-06-12 LAB — CYTOLOGY, (ORAL, ANAL, URETHRAL) ANCILLARY ONLY
Chlamydia: POSITIVE — AB
Comment: NEGATIVE
Comment: NORMAL
Neisseria Gonorrhea: NEGATIVE

## 2021-10-30 ENCOUNTER — Ambulatory Visit (HOSPITAL_COMMUNITY)
Admission: EM | Admit: 2021-10-30 | Discharge: 2021-10-30 | Disposition: A | Discharge status: Home / Self Care | Payer: Self-pay | Attending: Family Medicine | Admitting: Family Medicine

## 2021-10-30 ENCOUNTER — Encounter (HOSPITAL_COMMUNITY): Payer: Self-pay

## 2021-10-30 DIAGNOSIS — Z8619 Personal history of other infectious and parasitic diseases: Secondary | ICD-10-CM

## 2021-10-30 DIAGNOSIS — R300 Dysuria: Secondary | ICD-10-CM

## 2021-10-30 LAB — POCT URINALYSIS DIPSTICK, ED / UC
Bilirubin Urine: NEGATIVE
Glucose, UA: NEGATIVE mg/dL
Hgb urine dipstick: NEGATIVE
Ketones, ur: NEGATIVE mg/dL
Nitrite: NEGATIVE
Protein, ur: NEGATIVE mg/dL
Specific Gravity, Urine: 1.025 (ref 1.005–1.030)
Urobilinogen, UA: 1 mg/dL (ref 0.0–1.0)
pH: 7 (ref 5.0–8.0)

## 2021-10-30 MED ORDER — VALACYCLOVIR HCL 500 MG PO TABS: 500.0000 mg | ORAL_TABLET | Freq: Two times a day (BID) | ORAL | 0 refills | Status: AC

## 2021-10-30 MED ORDER — LIDOCAINE HCL (PF) 1 % IJ SOLN
INTRAMUSCULAR | Status: AC
  Filled 2021-10-30: qty 2

## 2021-10-30 MED ORDER — CEFTRIAXONE SODIUM 500 MG IJ SOLR
INTRAMUSCULAR | Status: AC
  Filled 2021-10-30: qty 500

## 2021-10-30 MED ORDER — CEFTRIAXONE SODIUM 500 MG IJ SOLR
500.0000 mg | Freq: Once | INTRAMUSCULAR | Status: AC
  Administered 2021-10-30: 500 mg via INTRAMUSCULAR

## 2021-10-30 NOTE — ED Triage Notes (Signed)
Pt presents with penile burning during urination the past few days.

## 2021-10-30 NOTE — Discharge Instructions (Addendum)
You were seen today for burning with urination.  Your urine did not appear to have a bacterial infection, but we will culture this and this will be resulted in 48-72 hrs.  If treatment is needed we will let you know.  Your STD test was done today.  This will be resulted tomorrow.  As you requested we will treat with rocephin IM x 1 today.  If you test positive for chlamydia, then we will notify you and will send out an oral antibiotic at that time.  I did send out valtrex for your history of herpes.  With an outbreak, then please start this asap.

## 2021-10-30 NOTE — ED Provider Notes (Signed)
[facility]    CSN: [id number] Arrival date & time: 10/30/21  1118      History   Chief Complaint Chief Complaint  Patient presents with   STD Testing     HPI Henry Lin is a 26 y.o. male.   Patient is here with burning with urination.  Going on about 1 week.  No penile d/c.  Mild frequency.  No pain in his abdomen or low back.  He is sexually active, 1 partner, no condom use.  No known exposures.  He does have h/o std.  He would like treatment with rocephin today if possible.  Willing to wait on oral doxy if needed.   He would like a refill of medication for herpes outbreak.  He was given acyclovir in the past but would like something else if possible.  Did not feel it worked very well for him  Past Medical History:  Diagnosis Date   Crohn's disease ([place])    Genital herpes     There are no problems to display for this patient.   History reviewed. No pertinent surgical history.     Home Medications    Prior to Admission medications   Medication Sig Start Date End Date Taking? Authorizing Provider  acyclovir (ZOVIRAX) 400 MG tablet At the start of an outbreak, take one tablet 3 times daily for 5 days. 08/08/20   Lamptey, Myrene Galas, MD  cyclobenzaprine (FLEXERIL) 10 MG tablet Take 1 tablet (10 mg total) by mouth 2 (two) times daily as needed for muscle spasms. 01/18/21   Malvin Johns, MD  ibuprofen (ADVIL) 600 MG tablet Take 1 tablet (600 mg total) by mouth every 6 (six) hours as needed. 01/18/21   Malvin Johns, MD  triamcinolone cream (KENALOG) 0.1 % Apply 1 application topically 3 (three) times daily as needed. 09/23/19   Scot Jun, FNP    Family History Family History  Problem Relation Age of Onset   Healthy Mother    Healthy Father     Social History Social History   Tobacco Use   Smoking status: Some Days    Types: Cigars   Smokeless tobacco: Never  Vaping Use   Vaping Use: Never used  Substance Use Topics   Alcohol use: No    Drug use: No     Allergies   Patient has no known allergies.   Review of Systems Review of Systems   Physical Exam Triage Vital Signs ED Triage Vitals  Enc Vitals Group     BP 10/30/21 1323 112/68     Pulse Rate 10/30/21 1323 65     Resp 10/30/21 1323 18     Temp 10/30/21 1323 97.9 F (36.6 C)     Temp Source 10/30/21 1323 Oral     SpO2 10/30/21 1323 98 %     Weight --      Height --      Head Circumference --      Peak Flow --      Pain Score 10/30/21 1326 4     Pain Loc --      Pain Edu? --      Excl. in [place]? --    No data found.  Updated Vital Signs BP 112/68 (BP Location: Right Arm)    Pulse 65    Temp 97.9 F (36.6 C) (Oral)    Resp 18    SpO2 98%   Visual Acuity Right Eye Distance:   Left Eye Distance:  Bilateral Distance:    Right Eye Near:   Left Eye Near:    Bilateral Near:     Physical Exam   UC Treatments / Results  Labs (all labs ordered are listed, but only abnormal results are displayed) Labs Reviewed  URINE CULTURE  POCT URINALYSIS DIPSTICK, ED / UC  CYTOLOGY, (ORAL, ANAL, URETHRAL) ANCILLARY ONLY    EKG   Radiology No results found.  Procedures Procedures (including critical care time)  Medications Ordered in UC Medications - No data to display  Initial Impression / Assessment and Plan / UC Course  I have reviewed the triage vital signs and the nursing notes.  Pertinent labs & imaging results that were available during my care of the patient were reviewed by me and considered in my medical decision making (see chart for details).   Patient was seen today for burning with urination.  His urine did not show obvious infection, so will hold off treatment at this time.  He did request STD testing today.  He requested IM rocephin while here today, which was given.  If chlamydia positive, will send out doxy bid.  He requested valtrex for herpes outbreaks to have on hand if needed.  This was sent out today.   Final Clinical  Impressions(s) / UC Diagnoses   Final diagnoses:  Dysuria     Discharge Instructions      You were seen today for burning with urination.  Your urine did not appear to have a bacterial infection, but we will culture this and this will be resulted in 48-72 hrs.  If treatment is needed we will let you know.  Your STD test was done today.  This will be resulted tomorrow.  As you requested we will treat with rocephin IM x 1 today.  If you test positive for chlamydia, then we will notify you and will send out an oral antibiotic at that time.     ED Prescriptions     Medication Sig Dispense Auth. Provider   valACYclovir (VALTREX) 500 MG tablet Take 1 tablet (500 mg total) by mouth 2 (two) times daily for 3 days. 6 tablet Rondel Oh, MD      PDMP not reviewed this encounter.   Rondel Oh, MD 10/30/21 1355

## 2021-10-31 LAB — CYTOLOGY, (ORAL, ANAL, URETHRAL) ANCILLARY ONLY
Chlamydia: NEGATIVE
Comment: NEGATIVE
Comment: NEGATIVE
Comment: NORMAL
Neisseria Gonorrhea: NEGATIVE
Trichomonas: NEGATIVE

## 2021-10-31 LAB — URINE CULTURE: Culture: 3000 — AB

## 2021-12-08 ENCOUNTER — Ambulatory Visit (HOSPITAL_COMMUNITY)
Admission: EM | Admit: 2021-12-08 | Discharge: 2021-12-08 | Disposition: A | Discharge status: Home / Self Care | Payer: Self-pay | Attending: Nurse Practitioner | Admitting: Nurse Practitioner

## 2021-12-08 ENCOUNTER — Encounter (HOSPITAL_COMMUNITY): Payer: Self-pay | Admitting: Emergency Medicine

## 2021-12-08 ENCOUNTER — Other Ambulatory Visit: Payer: Self-pay

## 2021-12-08 DIAGNOSIS — R369 Urethral discharge, unspecified: Secondary | ICD-10-CM | POA: Insufficient documentation

## 2021-12-08 HISTORY — DX: Crohn's disease, unspecified, without complications: K50.90

## 2021-12-08 MED ORDER — CEFTRIAXONE SODIUM 500 MG IJ SOLR
500.0000 mg | Freq: Once | INTRAMUSCULAR | Status: AC
  Administered 2021-12-08: 500 mg via INTRAMUSCULAR

## 2021-12-08 MED ORDER — LIDOCAINE HCL (PF) 1 % IJ SOLN
INTRAMUSCULAR | Status: AC
  Filled 2021-12-08: qty 2

## 2021-12-08 MED ORDER — LIDOCAINE HCL (PF) 1 % IJ SOLN
INTRAMUSCULAR | Status: AC
  Filled 2021-12-08: qty 30

## 2021-12-08 MED ORDER — CEFTRIAXONE SODIUM 500 MG IJ SOLR
INTRAMUSCULAR | Status: AC
  Filled 2021-12-08: qty 500

## 2021-12-08 NOTE — ED Triage Notes (Signed)
Patient woke with whitish discharge today.   ?

## 2021-12-08 NOTE — Discharge Instructions (Addendum)
-   We are giving you an antibiotic shot of ceftriaxone 500 mg today.   ?- We will let you know if any test results come back positive.  ?- Please avoid sexual activity until your test results come back and you are fully treated as well as your partner. ?-Please use condoms with every sexual encounter ? ?

## 2021-12-08 NOTE — ED Provider Notes (Signed)
?[facility] ? ? ? ?CSN: [id number] ?Arrival date & time: 12/08/21  1518 ? ? ?  ? ?History   ?Chief Complaint ?Chief Complaint  ?Patient presents with  ? Exposure to STD  ? ? ?HPI ?Henry Lin is a 26 y.o. male.  ? ?Patient presents with 1 day history of penile discharge.  He reports recent sexual activity without condom use.  Denies fevers, nausea, vomiting, swelling in his groin, rashes, lesions, or sores on his genitalia. ? ?He does have a history of genital herpes. ? ?He has a new sexual partner and is requesting STI testing today.  He declines HIV and syphilis testing. ? ? ?Past Medical History:  ?Diagnosis Date  ? Crohn disease ([place])   ? Crohn's disease ([place])   ? Genital herpes   ? ? ?There are no problems to display for this patient. ? ? ?History reviewed. No pertinent surgical history. ? ? ? ? ?Home Medications   ? ?Prior to Admission medications   ?Medication Sig Start Date End Date Taking? Authorizing Provider  ?cyclobenzaprine (FLEXERIL) 10 MG tablet Take 1 tablet (10 mg total) by mouth 2 (two) times daily as needed for muscle spasms. ?Patient not taking: Reported on 12/08/2021 01/18/21   Malvin Johns, MD  ?ibuprofen (ADVIL) 600 MG tablet Take 1 tablet (600 mg total) by mouth every 6 (six) hours as needed. 01/18/21   Malvin Johns, MD  ?triamcinolone cream (KENALOG) 0.1 % Apply 1 application topically 3 (three) times daily as needed. ?Patient not taking: Reported on 12/08/2021 09/23/19   Scot Jun, FNP  ? ? ?Family History ?Family History  ?Problem Relation Age of Onset  ? Healthy Mother   ? Healthy Father   ? ? ?Social History ?Social History  ? ?Tobacco Use  ? Smoking status: Some Days  ?  Types: Cigars  ? Smokeless tobacco: Never  ?Vaping Use  ? Vaping Use: Some days  ?Substance Use Topics  ? Alcohol use: Yes  ? Drug use: Yes  ?  Types: Marijuana  ? ? ? ?Allergies   ?Patient has no known allergies. ? ? ?Review of Systems ?Review of Systems ?Per HPI ? ?Physical Exam ?Triage Vital  Signs ?ED Triage Vitals  ?Enc Vitals Group  ?   BP 12/08/21 1652 109/65  ?   Pulse Rate 12/08/21 1652 73  ?   Resp 12/08/21 1652 18  ?   Temp 12/08/21 1652 98.1 ?F (36.7 ?C)  ?   Temp Source 12/08/21 1652 Oral  ?   SpO2 12/08/21 1652 99 %  ?   Weight --   ?   Height --   ?   Head Circumference --   ?   Peak Flow --   ?   Pain Score 12/08/21 1650 0  ?   Pain Loc --   ?   Pain Edu? --   ?   Excl. in [place]? --   ? ?No data found. ? ?Updated Vital Signs ?BP 109/65 (BP Location: Right Arm)   Pulse 73   Temp 98.1 ?F (36.7 ?C) (Oral)   Resp 18   SpO2 99%  ? ?Visual Acuity ?Right Eye Distance:   ?Left Eye Distance:   ?Bilateral Distance:   ? ?Right Eye Near:   ?Left Eye Near:    ?Bilateral Near:    ? ?Physical Exam ?Vitals and nursing note reviewed.  ?Constitutional:   ?   General: He is not in acute distress. ?   Appearance:  Normal appearance. He is not toxic-appearing.  ?Pulmonary:  ?   Effort: Pulmonary effort is normal. No respiratory distress.  ?Genitourinary: ?   Comments: Deferred ?Skin: ?   General: Skin is warm and dry.  ?   Capillary Refill: Capillary refill takes less than 2 seconds.  ?   Coloration: Skin is not jaundiced or pale.  ?Neurological:  ?   Mental Status: He is alert and oriented to person, place, and time.  ?   Motor: No weakness.  ?   Gait: Gait normal.  ?Psychiatric:     ?   Behavior: Behavior is cooperative.  ? ? ? ?UC Treatments / Results  ?Labs ?(all labs ordered are listed, but only abnormal results are displayed) ?Labs Reviewed  ?CYTOLOGY, (ORAL, ANAL, URETHRAL) ANCILLARY ONLY  ? ? ?EKG ? ? ?Radiology ?No results found. ? ?Procedures ?Procedures (including critical care time) ? ?Medications Ordered in UC ?Medications  ?cefTRIAXone (ROCEPHIN) injection 500 mg (has no administration in time range)  ? ? ?Initial Impression / Assessment and Plan / UC Course  ?I have reviewed the triage vital signs and the nursing notes. ? ?Pertinent labs & imaging results that were available during my care of the  patient were reviewed by me and considered in my medical decision making (see chart for details). ? ?  ?We will obtain testing via self swab.  Treat penile discharge with ceftriaxone 500 mg IM today.  Patient declines testing for HIV and syphilis today as he recently had testing.  Will notify with any other positive results and treat as indicated.  Encouraged condom use with every sexual encounter. ?Final Clinical Impressions(s) / UC Diagnoses  ? ?Final diagnoses:  ?Penile discharge  ? ? ? ?Discharge Instructions   ? ?  ?- We are giving you an antibiotic shot of ceftriaxone 500 mg today.   ?- We will let you know if any test results come back positive.  ?- Please avoid sexual activity until your test results come back and you are fully treated as well as your partner. ?-Please use condoms with every sexual encounter ? ? ? ? ? ?ED Prescriptions   ?None ?  ? ?PDMP not reviewed this encounter. ?  ?Eulogio Bear, NP ?12/08/21 1734 ? ?

## 2021-12-09 LAB — CYTOLOGY, (ORAL, ANAL, URETHRAL) ANCILLARY ONLY
Chlamydia: NEGATIVE
Comment: NEGATIVE
Comment: NEGATIVE
Comment: NORMAL
Neisseria Gonorrhea: NEGATIVE
Trichomonas: POSITIVE — AB

## 2021-12-10 ENCOUNTER — Telehealth (HOSPITAL_COMMUNITY): Payer: Self-pay | Admitting: Emergency Medicine

## 2021-12-10 MED ORDER — METRONIDAZOLE 500 MG PO TABS: 2000.0000 mg | ORAL_TABLET | Freq: Once | ORAL | 0 refills | Status: AC

## 2021-12-11 ENCOUNTER — Encounter (HOSPITAL_COMMUNITY): Payer: Self-pay

## 2021-12-11 ENCOUNTER — Telehealth (HOSPITAL_COMMUNITY): Payer: Self-pay | Admitting: Emergency Medicine

## 2021-12-11 MED ORDER — METRONIDAZOLE 500 MG PO TABS: 2000.0000 mg | ORAL_TABLET | Freq: Once | ORAL | 0 refills | Status: AC

## 2021-12-11 NOTE — Telephone Encounter (Signed)
Patient requested a different pharmacy, resent ?

## 2022-01-01 ENCOUNTER — Ambulatory Visit (HOSPITAL_COMMUNITY)
Admission: EM | Admit: 2022-01-01 | Discharge: 2022-01-01 | Disposition: A | Discharge status: Home / Self Care | Payer: Self-pay | Attending: Emergency Medicine | Admitting: Emergency Medicine

## 2022-01-01 ENCOUNTER — Encounter (HOSPITAL_COMMUNITY): Payer: Self-pay

## 2022-01-01 DIAGNOSIS — R369 Urethral discharge, unspecified: Secondary | ICD-10-CM | POA: Insufficient documentation

## 2022-01-01 DIAGNOSIS — Z202 Contact with and (suspected) exposure to infections with a predominantly sexual mode of transmission: Secondary | ICD-10-CM | POA: Insufficient documentation

## 2022-01-01 LAB — HIV ANTIBODY (ROUTINE TESTING W REFLEX): HIV Screen 4th Generation wRfx: NONREACTIVE

## 2022-01-01 MED ORDER — CEFTRIAXONE SODIUM 500 MG IJ SOLR
500.0000 mg | Freq: Once | INTRAMUSCULAR | Status: AC
  Administered 2022-01-01: 500 mg via INTRAMUSCULAR

## 2022-01-01 MED ORDER — CEFTRIAXONE SODIUM 500 MG IJ SOLR
INTRAMUSCULAR | Status: AC
  Filled 2022-01-01: qty 500

## 2022-01-01 MED ORDER — DOXYCYCLINE HYCLATE 100 MG PO CAPS
100.0000 mg | ORAL_CAPSULE | Freq: Two times a day (BID) | ORAL | 0 refills | Status: AC
Start: 2022-01-01 — End: 2022-01-08

## 2022-01-01 MED ORDER — METRONIDAZOLE 500 MG PO TABS: 500.0000 mg | ORAL_TABLET | Freq: Two times a day (BID) | ORAL | 0 refills | Status: DC

## 2022-01-01 NOTE — Discharge Instructions (Addendum)
Take both antibiotics as directed and practice safe sex.  No unprotected sex until completion of your treatment.  You will be called with results of your test from today.  Return if any new or worsening symptoms at any time for reevaluation ?

## 2022-01-01 NOTE — ED Triage Notes (Signed)
Pt presents for STD testing. Pt states he noticed penile discharge a few days ago. States his girlfriend is positive for Trichomoniasis.  ?

## 2022-01-01 NOTE — ED Provider Notes (Signed)
?[facility] ? ? ?MRN: [id number] DOB: [birth year] ? ?Subjective:  ? ?Chief Complaint;  ?Chief Complaint  ?Patient presents with  ? SEXUALLY TRANSMITTED DISEASE  ? ?Pt presents for STD testing. Pt states he noticed penile discharge a few days ago. States his girlfriend is positive for Trichomoniasis. ? ?Huxton LOWERY VLASIC is a 26 y.o. male presenting for penile discharge.  He denies abdominal pain fever but does have burning in urination.  He also has history of herpes. ? ? ?Current Facility-Administered Medications:  ?  cefTRIAXone (ROCEPHIN) injection 500 mg, 500 mg, Intramuscular, Once, Hezzie Bump, NP ? ?Current Outpatient Medications:  ?  doxycycline (VIBRAMYCIN) 100 MG capsule, Take 1 capsule (100 mg total) by mouth 2 (two) times daily for 7 days., Disp: 14 capsule, Rfl: 0 ?  metroNIDAZOLE (FLAGYL) 500 MG tablet, Take 1 tablet (500 mg total) by mouth 2 (two) times daily., Disp: 14 tablet, Rfl: 0 ?  cyclobenzaprine (FLEXERIL) 10 MG tablet, Take 1 tablet (10 mg total) by mouth 2 (two) times daily as needed for muscle spasms. (Patient not taking: Reported on 12/08/2021), Disp: 20 tablet, Rfl: 0 ?  ibuprofen (ADVIL) 600 MG tablet, Take 1 tablet (600 mg total) by mouth every 6 (six) hours as needed., Disp: 30 tablet, Rfl: 0 ?  triamcinolone cream (KENALOG) 0.1 %, Apply 1 application topically 3 (three) times daily as needed. (Patient not taking: Reported on 12/08/2021), Disp: 454 g, Rfl: 0  ? ?No Known Allergies ? ?Past Medical History:  ?Diagnosis Date  ? Crohn disease ([place])   ? Crohn's disease ([place])   ? Genital herpes   ?  ? ?Review of Systems  ?All other systems reviewed and are negative. ? ? ?Objective:  ? ?Vitals: ?BP (!) 104/56 (BP Location: Right Arm)   Pulse 62   Temp 98.2 ?F (36.8 ?C) (Oral)   Resp 17   SpO2 99%  ? ?Physical Exam ?Vitals and nursing note reviewed.  ?Constitutional:   ?   General: He is not in acute distress. ?   Appearance: He is well-developed.  ?HENT:  ?   Head:  Normocephalic and atraumatic.  ?Eyes:  ?   Conjunctiva/sclera: Conjunctivae normal.  ?Cardiovascular:  ?   Rate and Rhythm: Normal rate and regular rhythm.  ?   Heart sounds: No murmur heard. ?Pulmonary:  ?   Effort: Pulmonary effort is normal. No respiratory distress.  ?   Breath sounds: Normal breath sounds.  ?Abdominal:  ?   Palpations: Abdomen is soft.  ?   Tenderness: There is no abdominal tenderness.  ?Genitourinary: ?   Comments: Clear penile discharge ?Musculoskeletal:     ?   General: No swelling.  ?   Cervical back: Neck supple.  ?Skin: ?   General: Skin is warm and dry.  ?   Capillary Refill: Capillary refill takes less than 2 seconds.  ?Neurological:  ?   Mental Status: He is alert.  ?Psychiatric:     ?   Mood and Affect: Mood normal.  ? ? ?No results found for this or any previous visit (from the past 24 hour(s)). ? ?No results found.  ?  ? ?Assessment and Plan :  ? ?1. Penile discharge   ?2. Exposure to trichomonas   ? ? ?Meds ordered this encounter  ?Medications  ? cefTRIAXone (ROCEPHIN) injection 500 mg  ? metroNIDAZOLE (FLAGYL) 500 MG tablet  ?  Sig: Take 1 tablet (500 mg total) by mouth 2 (two) times  daily.  ?  Dispense:  14 tablet  ?  Refill:  0  ? doxycycline (VIBRAMYCIN) 100 MG capsule  ?  Sig: Take 1 capsule (100 mg total) by mouth 2 (two) times daily for 7 days.  ?  Dispense:  14 capsule  ?  Refill:  0  ? ? ?MDM:  ?CLIFORD DABISH is a 26 y.o. male presenting for patient presents with penile discharge with recent exposure from girlfriend who was diagnosed with track.  He denies fever or dysuria and vital signs are stable he has no abdominal pain.  Patient is given options for treatment and wished to have full treatment for STD including Rocephin 500 mg doxycycline and Flagyl.  He will be called with the results as discussed.  I discussed treatment, follow up and return instructions. Questions were answered. Patient stated understanding of instructions and is stable for discharge. ? ?Leida Lauth FNP-C [facility]CN  ?  ?Hezzie Bump, NP ?01/01/22 1518 ? ?

## 2022-01-02 LAB — RPR: RPR Ser Ql: NONREACTIVE

## 2022-01-05 LAB — CYTOLOGY, (ORAL, ANAL, URETHRAL) ANCILLARY ONLY
Chlamydia: NEGATIVE
Comment: NEGATIVE
Comment: NEGATIVE
Comment: NORMAL
Neisseria Gonorrhea: NEGATIVE
Trichomonas: POSITIVE — AB

## 2022-01-08 ENCOUNTER — Telehealth (HOSPITAL_COMMUNITY): Payer: Self-pay | Admitting: Emergency Medicine

## 2022-01-08 MED ORDER — METRONIDAZOLE 500 MG PO TABS: 500.0000 mg | ORAL_TABLET | Freq: Two times a day (BID) | ORAL | 0 refills | Status: DC

## 2022-01-08 NOTE — Telephone Encounter (Signed)
PAtient called and states pharmacy did not have prescriptions.  Called and they state they did not receive, resending one he needs, based on results ?

## 2022-03-24 ENCOUNTER — Ambulatory Visit (HOSPITAL_COMMUNITY)
Admission: EM | Admit: 2022-03-24 | Discharge: 2022-03-24 | Disposition: A | Discharge status: Home / Self Care | Payer: Self-pay | Attending: Emergency Medicine | Admitting: Emergency Medicine

## 2022-03-24 ENCOUNTER — Encounter (HOSPITAL_COMMUNITY): Payer: Self-pay

## 2022-03-24 DIAGNOSIS — R369 Urethral discharge, unspecified: Secondary | ICD-10-CM

## 2022-03-24 NOTE — ED Triage Notes (Signed)
Pt states he wants to be tested for STD's states a few weeks ago he had some discharge from his penis,but he is not having any now.

## 2022-03-24 NOTE — ED Provider Notes (Signed)
[facility]    CSN: [id number] Arrival date & time: 03/24/22  1009      History   Chief Complaint Chief Complaint  Patient presents with   SEXUALLY TRANSMITTED DISEASE    HPI Henry Lin is a 26 y.o. male.   Patient presents requesting STI testing.  Endorses that 3 to 4 weeks ago he had clear penile discharge lasting for 2 to 3 days before spontaneous resolution.  Sexually active, 1 male partner, sometimes condom use, no known exposure.  Denying all symptoms currently.    Past Medical History:  Diagnosis Date   Crohn disease (HCC)    Crohn's disease (HCC)    Genital herpes     There are no problems to display for this patient.   History reviewed. No pertinent surgical history.     Home Medications    Prior to Admission medications   Medication Sig Start Date End Date Taking? Authorizing Provider  cyclobenzaprine (FLEXERIL) 10 MG tablet Take 1 tablet (10 mg total) by mouth 2 (two) times daily as needed for muscle spasms. Patient not taking: Reported on 12/08/2021 01/18/21   Rolan Bucco, MD  ibuprofen (ADVIL) 600 MG tablet Take 1 tablet (600 mg total) by mouth every 6 (six) hours as needed. 01/18/21   Rolan Bucco, MD  metroNIDAZOLE (FLAGYL) 500 MG tablet Take 1 tablet (500 mg total) by mouth 2 (two) times daily. 01/08/22   Lamptey, Britta Mccreedy, MD  triamcinolone cream (KENALOG) 0.1 % Apply 1 application topically 3 (three) times daily as needed. Patient not taking: Reported on 12/08/2021 09/23/19   Bing Neighbors, FNP    Family History Family History  Problem Relation Age of Onset   Healthy Mother    Healthy Father     Social History Social History   Tobacco Use   Smoking status: Some Days    Types: Cigars   Smokeless tobacco: Never  Vaping Use   Vaping Use: Some days  Substance Use Topics   Alcohol use: Yes   Drug use: Yes    Types: Marijuana     Allergies   Patient has no known allergies.   Review of Systems Review of  Systems  Constitutional: Negative.   Respiratory: Negative.    Cardiovascular: Negative.   Genitourinary: Negative.   Skin: Negative.      Physical Exam Triage Vital Signs ED Triage Vitals [03/24/22 1022]  Enc Vitals Group     BP 116/73     Pulse Rate 80     Resp 16     Temp 98.2 F (36.8 C)     Temp Source Oral     SpO2 99 %     Weight      Height      Head Circumference      Peak Flow      Pain Score 0     Pain Loc      Pain Edu?      Excl. in [place]?    No data found.  Updated Vital Signs BP 116/73 (BP Location: Left Arm)   Pulse 80   Temp 98.2 F (36.8 C) (Oral)   Resp 16   SpO2 99%   Visual Acuity Right Eye Distance:   Left Eye Distance:   Bilateral Distance:    Right Eye Near:   Left Eye Near:    Bilateral Near:     Physical Exam Constitutional:      Appearance: Normal appearance.  Eyes:  Extraocular Movements: Extraocular movements intact.  Pulmonary:     Effort: Pulmonary effort is normal.  Genitourinary:    Comments: Deferred Neurological:     Mental Status: He is alert and oriented to person, place, and time. Mental status is at baseline.  Psychiatric:        Mood and Affect: Mood normal.        Behavior: Behavior normal.      UC Treatments / Results  Labs (all labs ordered are listed, but only abnormal results are displayed) Labs Reviewed  CYTOLOGY, (ORAL, ANAL, URETHRAL) ANCILLARY ONLY    EKG   Radiology No results found.  Procedures Procedures (including critical care time)  Medications Ordered in UC Medications - No data to display  Initial Impression / Assessment and Plan / UC Course  I have reviewed the triage vital signs and the nursing notes.  Pertinent labs & imaging results that were available during my care of the patient were reviewed by me and considered in my medical decision making (see chart for details).  Penile discharge  STI labs pending, will treat per protocol, advised abstinence until lab  results and/or treatment is complete, advised condom use during all sexual encounters moving forward Final Clinical Impressions(s) / UC Diagnoses   Final diagnoses:  Penile discharge     Discharge Instructions      Labs pending 2-3 days, you will be contacted if positive for any sti and treatment will be sent to the pharmacy, you will have to return to the clinic if positive for gonorrhea to receive treatment   Please refrain from having sex until labs results, if positive please refrain from having sex until treatment complete and symptoms resolve   If positive for Chlamydia  gonorrhea or trichomoniasis please notify partner or partners so they may tested as well  Moving forward, it is recommended you use some form of protection against the transmission of sti infections  such as condoms or dental dams with each sexual encounter     ED Prescriptions   None    PDMP not reviewed this encounter.   Valinda Hoar, NP 03/24/22 1049

## 2022-03-24 NOTE — Discharge Instructions (Signed)
Labs pending 2-3 days, you will be contacted if positive for any sti and treatment will be sent to the pharmacy, you will have to return to the clinic if positive for gonorrhea to receive treatment   Please refrain from having sex until labs results, if positive please refrain from having sex until treatment complete and symptoms resolve   If positive for  Chlamydia  gonorrhea or trichomoniasis please notify partner or partners so they may tested as well  Moving forward, it is recommended you use some form of protection against the transmission of sti infections  such as condoms or dental dams with each sexual encounter    

## 2022-03-25 ENCOUNTER — Telehealth (HOSPITAL_COMMUNITY): Payer: Self-pay | Admitting: Emergency Medicine

## 2022-03-25 LAB — CYTOLOGY, (ORAL, ANAL, URETHRAL) ANCILLARY ONLY
Chlamydia: NEGATIVE
Comment: NEGATIVE
Comment: NEGATIVE
Comment: NORMAL
Neisseria Gonorrhea: NEGATIVE
Trichomonas: POSITIVE — AB

## 2022-03-25 MED ORDER — METRONIDAZOLE 500 MG PO TABS: 2000.0000 mg | ORAL_TABLET | Freq: Once | ORAL | 0 refills | Status: AC

## 2022-05-07 ENCOUNTER — Ambulatory Visit (HOSPITAL_COMMUNITY)
Admission: EM | Admit: 2022-05-07 | Discharge: 2022-05-07 | Disposition: A | Discharge status: Home / Self Care | Payer: Self-pay | Attending: Physician Assistant | Admitting: Physician Assistant

## 2022-05-07 ENCOUNTER — Encounter (HOSPITAL_COMMUNITY): Payer: Self-pay | Admitting: *Deleted

## 2022-05-07 DIAGNOSIS — R369 Urethral discharge, unspecified: Secondary | ICD-10-CM | POA: Insufficient documentation

## 2022-05-07 DIAGNOSIS — R300 Dysuria: Secondary | ICD-10-CM | POA: Insufficient documentation

## 2022-05-07 DIAGNOSIS — N342 Other urethritis: Secondary | ICD-10-CM | POA: Insufficient documentation

## 2022-05-07 LAB — POCT URINALYSIS DIPSTICK, ED / UC
Bilirubin Urine: NEGATIVE
Glucose, UA: NEGATIVE mg/dL
Hgb urine dipstick: NEGATIVE
Ketones, ur: NEGATIVE mg/dL
Nitrite: NEGATIVE
Protein, ur: NEGATIVE mg/dL
Specific Gravity, Urine: 1.025 (ref 1.005–1.030)
Urobilinogen, UA: 1 mg/dL (ref 0.0–1.0)
pH: 6 (ref 5.0–8.0)

## 2022-05-07 MED ORDER — LIDOCAINE HCL (PF) 1 % IJ SOLN
INTRAMUSCULAR | Status: AC
  Filled 2022-05-07: qty 2

## 2022-05-07 MED ORDER — CEFTRIAXONE SODIUM 500 MG IJ SOLR
INTRAMUSCULAR | Status: AC
  Filled 2022-05-07: qty 500

## 2022-05-07 MED ORDER — CEFTRIAXONE SODIUM 500 MG IJ SOLR
500.0000 mg | Freq: Once | INTRAMUSCULAR | Status: AC
  Administered 2022-05-07: 500 mg via INTRAMUSCULAR

## 2022-05-07 MED ORDER — DOXYCYCLINE HYCLATE 100 MG PO CAPS: 100.0000 mg | ORAL_CAPSULE | Freq: Two times a day (BID) | ORAL | 0 refills | Status: DC

## 2022-05-07 NOTE — ED Provider Notes (Signed)
[facility]    CSN: [id number] Arrival date & time: 05/07/22  0946      History   Chief Complaint Chief Complaint  Patient presents with   Exposure to STD    HPI Henry Lin is a 26 y.o. male.   Patient is in today with a 2-week history of STI symptoms.  He reports mild dysuria, as well as discharge.  Denies any abdominal pain, fever, nausea, vomiting, genital sores.  He is sexually active with male partners and does not consistently use condoms.  He was seen by our clinic on 03/24/2022 at which point he tested positive for trichomonas.  He completed course of treatment and had resolution of symptoms until recurrence a few weeks later.  He denies additional antibiotic use recently.  He is requesting treatment given he is symptomatic and has difficulty getting to our clinic.  He had HIV and syphilis testing in April that was negative and declined repeat testing today.  He denies history of recurrent UTI, history of nephrolithiasis, hematuria, seeing a urologist in the past.    Past Medical History:  Diagnosis Date   Crohn disease ([place])    Crohn's disease ([place])    Genital herpes     There are no problems to display for this patient.   History reviewed. No pertinent surgical history.     Home Medications    Prior to Admission medications   Medication Sig Start Date End Date Taking? Authorizing Provider  doxycycline (VIBRAMYCIN) 100 MG capsule Take 1 capsule (100 mg total) by mouth 2 (two) times daily. 05/07/22  Yes Valisa Karpel, Derry Skill, PA-C    Family History Family History  Problem Relation Age of Onset   Healthy Mother    Healthy Father     Social History Social History   Tobacco Use   Smoking status: Some Days    Types: Cigars   Smokeless tobacco: Never  Vaping Use   Vaping Use: Some days  Substance Use Topics   Alcohol use: Yes   Drug use: Yes    Types: Marijuana     Allergies   Patient has no known allergies.   Review of  Systems Review of Systems  Constitutional:  Positive for activity change. Negative for appetite change, fatigue and fever.  Gastrointestinal:  Negative for abdominal pain, diarrhea, nausea and vomiting.  Genitourinary:  Positive for dysuria and penile discharge. Negative for flank pain, frequency, genital sores, hematuria, penile pain, penile swelling, testicular pain and urgency.     Physical Exam Triage Vital Signs ED Triage Vitals  Enc Vitals Group     BP 05/07/22 0957 106/69     Pulse Rate 05/07/22 0957 (!) 59     Resp 05/07/22 0957 18     Temp 05/07/22 0957 97.6 F (36.4 C)     Temp Source 05/07/22 0957 Oral     SpO2 05/07/22 0957 98 %     Weight --      Height --      Head Circumference --      Peak Flow --      Pain Score 05/07/22 0955 0     Pain Loc --      Pain Edu? --      Excl. in [place]? --    No data found.  Updated Vital Signs BP 106/69 (BP Location: Left Arm)   Pulse (!) 59   Temp 97.6 F (36.4 C) (Oral)   Resp 18   SpO2 98%  Visual Acuity Right Eye Distance:   Left Eye Distance:   Bilateral Distance:    Right Eye Near:   Left Eye Near:    Bilateral Near:     Physical Exam Vitals reviewed.  Constitutional:      General: He is awake.     Appearance: Normal appearance. He is well-developed. He is not ill-appearing.     Comments: Very pleasant male appears stated age in no acute distress sitting comfortably in exam room  HENT:     Head: Normocephalic and atraumatic.     Mouth/Throat:     Pharynx: No oropharyngeal exudate, posterior oropharyngeal erythema or uvula swelling.  Cardiovascular:     Rate and Rhythm: Normal rate and regular rhythm.     Heart sounds: Normal heart sounds, S1 normal and S2 normal. No murmur heard. Pulmonary:     Effort: Pulmonary effort is normal.     Breath sounds: Normal breath sounds. No stridor. No wheezing, rhonchi or rales.     Comments: Clear to auscultation bilaterally Abdominal:     General: Bowel sounds are  normal.     Palpations: Abdomen is soft.     Tenderness: There is no abdominal tenderness. There is no right CVA tenderness, left CVA tenderness, guarding or rebound.     Comments: Benign abdominal exam  Neurological:     Mental Status: He is alert.  Psychiatric:        Behavior: Behavior is cooperative.      UC Treatments / Results  Labs (all labs ordered are listed, but only abnormal results are displayed) Labs Reviewed  POCT URINALYSIS DIPSTICK, ED / UC - Abnormal; Notable for the following components:      Result Value   Leukocytes,Ua TRACE (*)    All other components within normal limits  URINE CULTURE  CYTOLOGY, (ORAL, ANAL, URETHRAL) ANCILLARY ONLY    EKG   Radiology No results found.  Procedures Procedures (including critical care time)  Medications Ordered in UC Medications  cefTRIAXone (ROCEPHIN) injection 500 mg (500 mg Intramuscular Given 05/07/22 1030)    Initial Impression / Assessment and Plan / UC Course  I have reviewed the triage vital signs and the nursing notes.  Pertinent labs & imaging results that were available during my care of the patient were reviewed by me and considered in my medical decision making (see chart for details).     UA positive for leukocyte Estrace likely related to urethritis.  We will send this for culture but defer additional antibiotics until results are available.  Patient was empirically treated for urethritis with Rocephin 500 mg in clinic today and doxycycline which was sent to the pharmacy.  Discussed that if he has trichomonas we will contact him to arrange additional treatment.  He is to rest and drink plenty of fluid.  Discussed that he is to abstain from sexual activity for minimum of 1 week after completing course of medication (2 weeks total).  Discussed the importance of safe sex practices and patient was provided education on sexually transmitted infections.  If he develops any abdominal pain, pelvic pain, fever,  nausea, vomiting he needs to be seen immediately.  Strict return precautions given.  Final Clinical Impressions(s) / UC Diagnoses   Final diagnoses:  Urethritis  Penile discharge  Dysuria     Discharge Instructions      We are treating you for sexually transmitted infections since you are having symptoms.  We gave you an injection today that will cover  for gonorrhea and doxycycline was sent to the pharmacy to cover for chlamydia.  If we need to arrange any additional treatment based on your results we will contact you.  You need to abstain from sex for minimum of 7 days after completing treatment (2 weeks total).  Use a condom with each sexual encounter.  If you develop any worsening symptoms you need to be seen immediately.     ED Prescriptions     Medication Sig Dispense Auth. Provider   doxycycline (VIBRAMYCIN) 100 MG capsule Take 1 capsule (100 mg total) by mouth 2 (two) times daily. 14 capsule Teyah Rossy K, PA-C      PDMP not reviewed this encounter.   Terrilee Croak, PA-C 05/07/22 1039

## 2022-05-07 NOTE — ED Triage Notes (Signed)
Patient complains of burning on penis and cloudy white discharge X 2 weeks. Would like to be treated today if possible due to transportation issues.

## 2022-05-07 NOTE — Discharge Instructions (Signed)
We are treating you for sexually transmitted infections since you are having symptoms.  We gave you an injection today that will cover for gonorrhea and doxycycline was sent to the pharmacy to cover for chlamydia.  If we need to arrange any additional treatment based on your results we will contact you.  You need to abstain from sex for minimum of 7 days after completing treatment (2 weeks total).  Use a condom with each sexual encounter.  If you develop any worsening symptoms you need to be seen immediately.

## 2022-05-08 LAB — CYTOLOGY, (ORAL, ANAL, URETHRAL) ANCILLARY ONLY
Chlamydia: NEGATIVE
Comment: NEGATIVE
Comment: NEGATIVE
Comment: NORMAL
Neisseria Gonorrhea: NEGATIVE
Trichomonas: NEGATIVE

## 2022-05-08 LAB — URINE CULTURE: Culture: NO GROWTH

## 2022-05-29 ENCOUNTER — Encounter (HOSPITAL_COMMUNITY): Payer: Self-pay

## 2022-05-29 ENCOUNTER — Ambulatory Visit (HOSPITAL_COMMUNITY)
Admission: EM | Admit: 2022-05-29 | Discharge: 2022-05-29 | Disposition: A | Discharge status: Home / Self Care | Payer: Self-pay | Attending: Nurse Practitioner | Admitting: Nurse Practitioner

## 2022-05-29 DIAGNOSIS — R369 Urethral discharge, unspecified: Secondary | ICD-10-CM

## 2022-05-29 DIAGNOSIS — Z202 Contact with and (suspected) exposure to infections with a predominantly sexual mode of transmission: Secondary | ICD-10-CM

## 2022-05-29 NOTE — ED Triage Notes (Signed)
Pt presents to  urgent care for painful urination and and white discharge on his penis.

## 2022-05-29 NOTE — ED Provider Notes (Signed)
[facility]    CSN: [id number] Arrival date & time: 05/29/22  1547      History   Chief Complaint Chief Complaint  Patient presents with   Exposure to STD    HPI Henry Lin is a 26 y.o. male.   HPI  He is in today for dysuria and penile discharge. He reports that he was treated for gonorrhea 3 weeks ago however his test was negative. He is now having worsening discharge and pain with urination. He is upset due to having to wait.  Past Medical History:  Diagnosis Date   Crohn disease ([place])    Crohn's disease ([place])    Genital herpes     There are no problems to display for this patient.   History reviewed. No pertinent surgical history.     Home Medications    Prior to Admission medications   Medication Sig Start Date End Date Taking? Authorizing Provider  doxycycline (VIBRAMYCIN) 100 MG capsule Take 1 capsule (100 mg total) by mouth 2 (two) times daily. 05/07/22   Raspet, Derry Skill, PA-C    Family History Family History  Problem Relation Age of Onset   Healthy Mother    Healthy Father     Social History Social History   Tobacco Use   Smoking status: Some Days    Types: Cigars   Smokeless tobacco: Never  Vaping Use   Vaping Use: Some days  Substance Use Topics   Alcohol use: Yes   Drug use: Yes    Types: Marijuana     Allergies   Patient has no known allergies.   Review of Systems Review of Systems   Physical Exam Triage Vital Signs ED Triage Vitals [05/29/22 1715]  Enc Vitals Group     BP 110/72     Pulse Rate 78     Resp 18     Temp (!) 97.1 F (36.2 C)     Temp Source Oral     SpO2 98 %     Weight      Height      Head Circumference      Peak Flow      Pain Score      Pain Loc      Pain Edu?      Excl. in [place]?    No data found.  Updated Vital Signs BP 110/72 (BP Location: Left Arm)   Pulse 78   Temp (!) 97.1 F (36.2 C) (Oral)   Resp 18   SpO2 98%   Visual Acuity Right Eye Distance:   Left Eye  Distance:   Bilateral Distance:    Right Eye Near:   Left Eye Near:    Bilateral Near:     Physical Exam HENT:     Head: Normocephalic.  Cardiovascular:     Rate and Rhythm: Normal rate.  Pulmonary:     Effort: Pulmonary effort is normal.  Genitourinary:    Comments: deferred Musculoskeletal:        General: Normal range of motion.  Neurological:     Mental Status: He is alert.  Psychiatric:     Comments: Upset due to his 4 hour wait.  He declined a rocephin injection       UC Treatments / Results  Labs (all labs ordered are listed, but only abnormal results are displayed) Labs Reviewed  CYTOLOGY, (ORAL, ANAL, URETHRAL) ANCILLARY ONLY    EKG   Radiology No results found.  Procedures Procedures (  including critical care time)  Medications Ordered in UC Medications - No data to display  Initial Impression / Assessment and Plan / UC Course  I have reviewed the triage vital signs and the nursing notes.  Pertinent labs & imaging results that were available during my care of the patient were reviewed by me and considered in my medical decision making (see chart for details).     Penile Discharge Final Clinical Impressions(s) / UC Diagnoses   Final diagnoses:  Penile discharge  Exposure to STD     Discharge Instructions      STD testing is pending for Gonorrhea, Chlamydia, trichomoniasis pending Declined HIV and RPR testing  Declined Ceftriaxone due to not wanting to wait  Will hold any other treatment at this time due to recurrent infection of all Gonorrhea, Chlamydia, trichomoniasis  Encourage Condom Use       ED Prescriptions   None    PDMP not reviewed this encounter.   Dionisio David [place], [place] 05/29/22 [phone]

## 2022-05-29 NOTE — Discharge Instructions (Addendum)
STD testing is pending for Gonorrhea, Chlamydia, trichomoniasis pending Declined HIV and RPR testing  Declined Ceftriaxone due to not wanting to wait  Will hold any other treatment at this time due to recurrent infection of all Gonorrhea, Chlamydia, trichomoniasis  Encourage Condom Use

## 2022-06-01 LAB — CYTOLOGY, (ORAL, ANAL, URETHRAL) ANCILLARY ONLY
Chlamydia: NEGATIVE
Comment: NEGATIVE
Comment: NEGATIVE
Comment: NORMAL
Neisseria Gonorrhea: POSITIVE — AB
Trichomonas: NEGATIVE

## 2022-06-02 ENCOUNTER — Encounter (HOSPITAL_COMMUNITY): Payer: Self-pay

## 2022-06-02 ENCOUNTER — Telehealth (HOSPITAL_COMMUNITY): Payer: Self-pay | Admitting: Emergency Medicine

## 2022-06-02 ENCOUNTER — Ambulatory Visit (HOSPITAL_COMMUNITY)
Admission: RE | Admit: 2022-06-02 | Discharge: 2022-06-02 | Disposition: A | Discharge status: Home / Self Care | Payer: Self-pay | Source: Ambulatory Visit | Attending: Family Medicine | Admitting: Family Medicine

## 2022-06-02 DIAGNOSIS — A549 Gonococcal infection, unspecified: Secondary | ICD-10-CM

## 2022-06-02 MED ORDER — LIDOCAINE HCL (PF) 1 % IJ SOLN
INTRAMUSCULAR | Status: AC
  Filled 2022-06-02: qty 2

## 2022-06-02 MED ORDER — CEFTRIAXONE SODIUM 500 MG IJ SOLR
500.0000 mg | Freq: Once | INTRAMUSCULAR | Status: AC
  Administered 2022-06-02: 500 mg via INTRAMUSCULAR

## 2022-06-02 MED ORDER — CEFTRIAXONE SODIUM 500 MG IJ SOLR
INTRAMUSCULAR | Status: AC
  Filled 2022-06-02: qty 500

## 2022-06-02 NOTE — Telephone Encounter (Signed)
Per protocol, patient will need treatment with IM Rocephin  500 mg for positive Gonorrhea Contacted patient by phone.  Verified identity using two identifiers.  Provided positive result.  Reviewed safe sex practices, notifying partners, and refraining from sexual activities for 7 days from time of treatment.  Patient verified understanding, all questions answered.   [facility] notified

## 2022-06-02 NOTE — ED Triage Notes (Signed)
Pt states he is here for treatment for STI.   States that he hasnt been having safe sex since last test and wants to know if he can be reswabbed to make sure he doesn't have anything else.

## 2022-06-15 ENCOUNTER — Encounter (HOSPITAL_COMMUNITY): Payer: Self-pay

## 2022-06-15 ENCOUNTER — Ambulatory Visit (HOSPITAL_COMMUNITY)
Admission: RE | Admit: 2022-06-15 | Discharge: 2022-06-15 | Disposition: A | Discharge status: Home / Self Care | Payer: Self-pay | Source: Ambulatory Visit | Attending: Emergency Medicine | Admitting: Emergency Medicine

## 2022-06-15 VITALS — BP 107/76 | HR 77 | Temp 98.1°F | Resp 17

## 2022-06-15 DIAGNOSIS — Z202 Contact with and (suspected) exposure to infections with a predominantly sexual mode of transmission: Secondary | ICD-10-CM

## 2022-06-15 DIAGNOSIS — Z113 Encounter for screening for infections with a predominantly sexual mode of transmission: Secondary | ICD-10-CM | POA: Insufficient documentation

## 2022-06-15 NOTE — Discharge Instructions (Signed)
Labs pending 2-3 days, you will be contacted if positive for any sti and treatment will be sent to the pharmacy, you will have to return to the clinic if positive for gonorrhea to receive treatment   Please refrain from having sex until labs results, if positive please refrain from having sex until treatment complete and symptoms resolve   If positive for , Chlamydia  gonorrhea or trichomoniasis please notify partner or partners so they may tested as well  Moving forward, it is recommended you use some form of protection against the transmission of sti infections  such as condoms or dental dams with each sexual encounter

## 2022-06-15 NOTE — ED Triage Notes (Signed)
Pt presents for STI testing. Tested positive for Gonorrhea And received treatment. Requesting to be tested to make sure treatment was effective. Denies any current symptoms.

## 2022-06-15 NOTE — ED Provider Notes (Signed)
[facility]    CSN: [id number] Arrival date & time: 06/15/22  1521      History   Chief Complaint Chief Complaint  Patient presents with   SEXUALLY TRANSMITTED DISEASE    HPI Henry Lin is a 26 y.o. male.    Patient presents requesting routine STI testing.  Denies all symptoms.  Endorses that on September 12 he was treated for gonorrhea here in office, last sexual encounter 1 week ago, endorses that his partner also received treatment.    Past Medical History:  Diagnosis Date   Crohn disease ([place])    Crohn's disease ([place])    Genital herpes     There are no problems to display for this patient.   History reviewed. No pertinent surgical history.     Home Medications    Prior to Admission medications   Medication Sig Start Date End Date Taking? Authorizing Provider  doxycycline (VIBRAMYCIN) 100 MG capsule Take 1 capsule (100 mg total) by mouth 2 (two) times daily. 05/07/22   Raspet, Derry Skill, PA-C    Family History Family History  Problem Relation Age of Onset   Healthy Mother    Healthy Father     Social History Social History   Tobacco Use   Smoking status: Some Days    Types: Cigars   Smokeless tobacco: Never  Vaping Use   Vaping Use: Some days  Substance Use Topics   Alcohol use: Yes   Drug use: Yes    Types: Marijuana     Allergies   Patient has no known allergies.   Review of Systems Review of Systems  Constitutional: Negative.   Respiratory: Negative.    Cardiovascular: Negative.   Genitourinary: Negative.   Skin: Negative.      Physical Exam Triage Vital Signs ED Triage Vitals  Enc Vitals Group     BP 06/15/22 1542 107/76     Pulse Rate 06/15/22 1542 77     Resp 06/15/22 1542 17     Temp 06/15/22 1542 98.1 F (36.7 C)     Temp Source 06/15/22 1542 Oral     SpO2 06/15/22 1542 96 %     Weight --      Height --      Head Circumference --      Peak Flow --      Pain Score 06/15/22 1541 0     Pain Loc --       Pain Edu? --      Excl. in [place]? --    No data found.  Updated Vital Signs BP 107/76 (BP Location: Left Arm)   Pulse 77   Temp 98.1 F (36.7 C) (Oral)   Resp 17   SpO2 96%   Visual Acuity Right Eye Distance:   Left Eye Distance:   Bilateral Distance:    Right Eye Near:   Left Eye Near:    Bilateral Near:     Physical Exam Constitutional:      Appearance: Normal appearance.  Eyes:     Extraocular Movements: Extraocular movements intact.  Pulmonary:     Effort: Pulmonary effort is normal.  Genitourinary:    Comments: Deferred Neurological:     Mental Status: He is alert and oriented to person, place, and time.      UC Treatments / Results  Labs (all labs ordered are listed, but only abnormal results are displayed) Labs Reviewed  CYTOLOGY, (ORAL, ANAL, URETHRAL) ANCILLARY ONLY    EKG  Radiology No results found.  Procedures Procedures (including critical care time)  Medications Ordered in UC Medications - No data to display  Initial Impression / Assessment and Plan / UC Course  I have reviewed the triage vital signs and the nursing notes.  Pertinent labs & imaging results that were available during my care of the patient were reviewed by me and considered in my medical decision making (see chart for details).  Routine screening for STI   STI labs pending will treat per protocol, advised abstinence until lab results, and/or treatment is complete, advised condom use during all sexual encounters moving, may follow-up with urgent care as needed  Final Clinical Impressions(s) / UC Diagnoses   Final diagnoses:  None   Discharge Instructions   None    ED Prescriptions   None    PDMP not reviewed this encounter.   Hans [place], [place] 06/15/22 [phone]

## 2022-06-16 LAB — CYTOLOGY, (ORAL, ANAL, URETHRAL) ANCILLARY ONLY
Chlamydia: POSITIVE — AB
Comment: NEGATIVE
Comment: NEGATIVE
Comment: NORMAL
Neisseria Gonorrhea: NEGATIVE
Trichomonas: NEGATIVE

## 2022-06-17 ENCOUNTER — Telehealth (HOSPITAL_COMMUNITY): Payer: Self-pay | Admitting: Emergency Medicine

## 2022-06-17 MED ORDER — DOXYCYCLINE HYCLATE 100 MG PO CAPS: 100.0000 mg | ORAL_CAPSULE | Freq: Two times a day (BID) | ORAL | 0 refills | Status: AC

## 2022-08-26 ENCOUNTER — Ambulatory Visit (HOSPITAL_COMMUNITY): Payer: Self-pay

## 2022-08-27 ENCOUNTER — Ambulatory Visit (HOSPITAL_COMMUNITY): Payer: Self-pay

## 2022-09-08 ENCOUNTER — Encounter (HOSPITAL_COMMUNITY): Payer: Self-pay

## 2022-09-08 ENCOUNTER — Ambulatory Visit (HOSPITAL_COMMUNITY)
Admission: RE | Admit: 2022-09-08 | Discharge: 2022-09-08 | Disposition: A | Discharge status: Home / Self Care | Payer: Self-pay | Source: Ambulatory Visit | Attending: Internal Medicine | Admitting: Internal Medicine

## 2022-09-08 VITALS — BP 127/57 | HR 78 | Temp 98.1°F | Resp 18

## 2022-09-08 DIAGNOSIS — R300 Dysuria: Secondary | ICD-10-CM | POA: Insufficient documentation

## 2022-09-08 DIAGNOSIS — Z202 Contact with and (suspected) exposure to infections with a predominantly sexual mode of transmission: Secondary | ICD-10-CM | POA: Insufficient documentation

## 2022-09-08 MED ORDER — AZITHROMYCIN 250 MG PO TABS
1000.0000 mg | ORAL_TABLET | Freq: Once | ORAL | Status: AC
  Administered 2022-09-08: 1000 mg via ORAL

## 2022-09-08 MED ORDER — AZITHROMYCIN 250 MG PO TABS
ORAL_TABLET | ORAL | Status: AC
  Filled 2022-09-08: qty 4

## 2022-09-08 NOTE — ED Provider Notes (Signed)
[name]    CSN: [id number] Arrival date & time: 09/08/22  1403      History   Chief Complaint Chief Complaint  Patient presents with   SEXUALLY TRANSMITTED DISEASE    Exposed to an STD - Entered by patient    HPI Henry Lin is a 26 y.o. male.   Patient presents urgent care for evaluation of dysuria after positive exposure to partner with chlamydia.  He is sexually active with male partners without condoms.  History of genital herpes, not currently having an outbreak.  Denies penile discharge, genital rash, abdominal pain, urinary frequency, urinary urgency, urinary hesitancy, hematuria, nausea, vomiting, dizziness, diarrhea, constipation, and low back pain.  No fevers or chills.  No recent antibiotic use.  He states he was treated for chlamydia approximately 3 months ago and did not tolerate doxycycline well.  While taking the doxycycline, he ended up experiencing nausea and vomiting and was unable to take the entire course of antibiotics.  He is requesting treatment with a different method today.     Past Medical History:  Diagnosis Date   Crohn disease ([employer])    Crohn's disease ([place])    Genital herpes     There are no problems to display for this patient.   History reviewed. No pertinent surgical history.     Home Medications    Prior to Admission medications   Not on File    Family History Family History  Problem Relation Age of Onset   Healthy Mother    Healthy Father     Social History Social History   Tobacco Use   Smoking status: Some Days    Types: Cigars   Smokeless tobacco: Never  Vaping Use   Vaping Use: Some days  Substance Use Topics   Alcohol use: Yes   Drug use: Yes    Types: Marijuana     Allergies   Patient has no known allergies.   Review of Systems Review of Systems Per HPI  Physical Exam Triage Vital Signs ED Triage Vitals  Enc Vitals Group     BP 09/08/22 1416 (!) 127/57     Pulse Rate  09/08/22 1416 78     Resp 09/08/22 1416 18     Temp 09/08/22 1416 98.1 F (36.7 C)     Temp Source 09/08/22 1416 Oral     SpO2 09/08/22 1416 98 %     Weight --      Height --      Head Circumference --      Peak Flow --      Pain Score 09/08/22 1418 0     Pain Loc --      Pain Edu? --      Excl. in [place]? --    No data found.  Updated Vital Signs BP (!) 127/57 (BP Location: Left Arm)   Pulse 78   Temp 98.1 F (36.7 C) (Oral)   Resp 18   SpO2 98%   Visual Acuity Right Eye Distance:   Left Eye Distance:   Bilateral Distance:    Right Eye Near:   Left Eye Near:    Bilateral Near:     Physical Exam Vitals and nursing note reviewed.  Constitutional:      Appearance: He is not ill-appearing or toxic-appearing.  HENT:     Head: Normocephalic and atraumatic.     Right Ear: Hearing and external ear normal.     Left Ear: Hearing and  external ear normal.     Nose: Nose normal.     Mouth/Throat:     Lips: Pink.  Eyes:     General: Lids are normal. Vision grossly intact. Gaze aligned appropriately.     Extraocular Movements: Extraocular movements intact.     Conjunctiva/sclera: Conjunctivae normal.  Pulmonary:     Effort: Pulmonary effort is normal.  Genitourinary:    Comments: Deferred. Musculoskeletal:     Cervical back: Neck supple.  Skin:    General: Skin is warm and dry.     Capillary Refill: Capillary refill takes less than 2 seconds.     Findings: No rash.  Neurological:     General: No focal deficit present.     Mental Status: He is alert and oriented to person, place, and time. Mental status is at baseline.     Cranial Nerves: No dysarthria or facial asymmetry.  Psychiatric:        Mood and Affect: Mood normal.        Speech: Speech normal.        Behavior: Behavior normal.        Thought Content: Thought content normal.        Judgment: Judgment normal.      UC Treatments / Results  Labs (all labs ordered are listed, but only abnormal results are  displayed) Labs Reviewed  CYTOLOGY, (ORAL, ANAL, URETHRAL) ANCILLARY ONLY    EKG   Radiology No results found.  Procedures Procedures (including critical care time)  Medications Ordered in UC Medications  azithromycin (ZITHROMAX) tablet 1,000 mg (has no administration in time range)    Initial Impression / Assessment and Plan / UC Course  I have reviewed the triage vital signs and the nursing notes.  Pertinent labs & imaging results that were available during my care of the patient were reviewed by me and considered in my medical decision making (see chart for details).   1.  Dysuria, exposure to STD Cytology is pending.  We will call patient with results in the next 2 to 3 days if positive or if results change treatment plan.  Plan to go ahead and treat for chlamydia today with azithromycin 1 g since patient did not tolerate doxycycline well.  Discussed that this method of treatment has lower efficacy as compared to doxycycline.  If he continues to have symptoms, he must return to urgent care for further evaluation.  He is to wait for 7 days to have intercourse while being treated for probable STI.  We will treat for all other STIs as necessary based off of cytology swab.  He declines HIV and syphilis testing today as he has been screened for these in the past.   Discussed physical exam and available lab work findings in clinic with patient.  Counseled patient regarding appropriate use of medications and potential side effects for all medications recommended or prescribed today. Discussed red flag signs and symptoms of worsening condition,when to call the PCP office, return to urgent care, and when to seek higher level of care in the emergency department. Patient verbalizes understanding and agreement with plan. All questions answered. Patient discharged in stable condition.   Final Clinical Impressions(s) / UC Diagnoses   Final diagnoses:  Dysuria  Exposure to STD      Discharge Instructions      Your STD testing has been sent to the lab and will come back in the next 2 to 3 days.  We will call you if any  of your results are positive requiring treatment.  We went ahead and treated you for chlamydia today with azithromycin.   Avoid sexual intercourse for 7 days while you are being treated for STD. Condom use is the best way to prevent spread of STDs.  Return to urgent care as needed.    ED Prescriptions   None    PDMP not reviewed this encounter.   Talbot Grumbling, [place] 09/08/22 1450

## 2022-09-08 NOTE — ED Triage Notes (Addendum)
Expose to chlamydia.Pt would like to be tested. Denies any symptoms at this time.

## 2022-09-08 NOTE — Discharge Instructions (Signed)
Your STD testing has been sent to the lab and will come back in the next 2 to 3 days.  We will call you if any of your results are positive requiring treatment.  We went ahead and treated you for chlamydia today with azithromycin.   Avoid sexual intercourse for 7 days while you are being treated for STD. Condom use is the best way to prevent spread of STDs.  Return to urgent care as needed.

## 2022-09-09 LAB — CYTOLOGY, (ORAL, ANAL, URETHRAL) ANCILLARY ONLY
Chlamydia: NEGATIVE
Comment: NEGATIVE
Comment: NEGATIVE
Comment: NORMAL
Neisseria Gonorrhea: POSITIVE — AB
Trichomonas: POSITIVE — AB

## 2022-09-10 ENCOUNTER — Telehealth (HOSPITAL_COMMUNITY): Payer: Self-pay | Admitting: Emergency Medicine

## 2022-09-10 MED ORDER — METRONIDAZOLE 500 MG PO TABS: 2000.0000 mg | ORAL_TABLET | Freq: Once | ORAL | 0 refills | Status: AC

## 2022-09-10 NOTE — Telephone Encounter (Signed)
Per protocol, patient will need treatment with IM Rocephin 5110m for positive Gonorrhea. Will also need treatment with Flagyl.  Contacted patient by phone.  Verified identity using two identifiers.  Provided positive result.  Reviewed safe sex practices, notifying partners, and refraining from sexual activities for 7 days from time of treatment.  Patient verified understanding, all questions answered.

## 2022-09-11 ENCOUNTER — Ambulatory Visit (HOSPITAL_COMMUNITY)
Admission: RE | Admit: 2022-09-11 | Discharge: 2022-09-11 | Disposition: A | Discharge status: Home / Self Care | Payer: Self-pay | Source: Ambulatory Visit | Attending: Internal Medicine | Admitting: Internal Medicine

## 2022-09-11 ENCOUNTER — Encounter (HOSPITAL_COMMUNITY): Payer: Self-pay

## 2022-09-11 DIAGNOSIS — A549 Gonococcal infection, unspecified: Secondary | ICD-10-CM

## 2022-09-11 MED ORDER — LIDOCAINE HCL (PF) 1 % IJ SOLN
INTRAMUSCULAR | Status: AC
  Filled 2022-09-11: qty 2

## 2022-09-11 MED ORDER — CEFTRIAXONE SODIUM 500 MG IJ SOLR
INTRAMUSCULAR | Status: AC
  Filled 2022-09-11: qty 500

## 2022-09-11 MED ORDER — CEFTRIAXONE SODIUM 250 MG IJ SOLR
500.0000 mg | Freq: Once | INTRAMUSCULAR | Status: AC
  Administered 2022-09-11: 500 mg via INTRAMUSCULAR

## 2022-09-11 NOTE — ED Triage Notes (Signed)
Pt here for std treatment.

## 2022-10-14 ENCOUNTER — Ambulatory Visit (HOSPITAL_COMMUNITY)
Admission: RE | Admit: 2022-10-14 | Discharge: 2022-10-14 | Disposition: A | Discharge status: Home / Self Care | Payer: Self-pay | Source: Ambulatory Visit | Attending: Internal Medicine | Admitting: Internal Medicine

## 2022-10-14 ENCOUNTER — Encounter (HOSPITAL_COMMUNITY): Payer: Self-pay

## 2022-10-14 VITALS — BP 115/71 | HR 94 | Temp 98.0°F | Resp 12

## 2022-10-14 DIAGNOSIS — Z202 Contact with and (suspected) exposure to infections with a predominantly sexual mode of transmission: Secondary | ICD-10-CM | POA: Insufficient documentation

## 2022-10-14 DIAGNOSIS — Z711 Person with feared health complaint in whom no diagnosis is made: Secondary | ICD-10-CM | POA: Insufficient documentation

## 2022-10-14 NOTE — ED Triage Notes (Signed)
Pt is here for follow up STD check .

## 2022-10-14 NOTE — Discharge Instructions (Signed)

## 2022-10-14 NOTE — ED Provider Notes (Signed)
[place]    CSN: [id number] Arrival date & time: 10/14/22  1311      History   Chief Complaint Chief Complaint  Patient presents with   Exposure to STD    Burning when I pee - Entered by patient   Follow-up    HPI Henry Lin is a 27 y.o. male.   Patient presents to urgent care for STD screening. Reports recent new sexual partner recently.  Slight dysuria started yesterday, he has not had any urinary frequency, urgency, or gross hematuria.  Was treated for gonorrhea and trichomonas last month.  No known recent exposure to STD.  He is not having any penile discharge, rash, or penile itching.   Exposure to STD    Past Medical History:  Diagnosis Date   Crohn disease ([place])    Crohn's disease ([place])    Genital herpes     There are no problems to display for this patient.   History reviewed. No pertinent surgical history.     Home Medications    Prior to Admission medications   Not on File    Family History Family History  Problem Relation Age of Onset   Healthy Mother    Healthy Father     Social History Social History   Tobacco Use   Smoking status: Some Days    Types: Cigars   Smokeless tobacco: Never  Vaping Use   Vaping Use: Some days  Substance Use Topics   Alcohol use: Yes   Drug use: Yes    Types: Marijuana     Allergies   Patient has no known allergies.   Review of Systems Review of Systems Per HPI  Physical Exam Triage Vital Signs ED Triage Vitals  Enc Vitals Group     BP 10/14/22 1348 115/71     Pulse Rate 10/14/22 1348 94     Resp 10/14/22 1348 12     Temp 10/14/22 1348 98 F (36.7 C)     Temp Source 10/14/22 1348 Oral     SpO2 10/14/22 1348 98 %     Weight --      Height --      Head Circumference --      Peak Flow --      Pain Score 10/14/22 1347 0     Pain Loc --      Pain Edu? --      Excl. in [place]? --    No data found.  Updated Vital Signs BP 115/71 (BP Location: Left Arm)   Pulse 94    Temp 98 F (36.7 C) (Oral)   Resp 12   SpO2 98%   Visual Acuity Right Eye Distance:   Left Eye Distance:   Bilateral Distance:    Right Eye Near:   Left Eye Near:    Bilateral Near:     Physical Exam Vitals and nursing note reviewed.  Constitutional:      Appearance: He is not ill-appearing or toxic-appearing.  HENT:     Head: Normocephalic and atraumatic.     Right Ear: Hearing and external ear normal.     Left Ear: Hearing and external ear normal.     Nose: Nose normal.     Mouth/Throat:     Lips: Pink.  Eyes:     General: Lids are normal. Vision grossly intact. Gaze aligned appropriately.     Extraocular Movements: Extraocular movements intact.     Conjunctiva/sclera: Conjunctivae normal.  Pulmonary:  Effort: Pulmonary effort is normal.  Genitourinary:    Comments: Deferred. Musculoskeletal:     Cervical back: Neck supple.  Skin:    General: Skin is warm and dry.     Capillary Refill: Capillary refill takes less than 2 seconds.     Findings: No rash.  Neurological:     General: No focal deficit present.     Mental Status: He is alert and oriented to person, place, and time. Mental status is at baseline.     Cranial Nerves: No dysarthria or facial asymmetry.  Psychiatric:        Mood and Affect: Mood normal.        Speech: Speech normal.        Behavior: Behavior normal.        Thought Content: Thought content normal.        Judgment: Judgment normal.      UC Treatments / Results  Labs (all labs ordered are listed, but only abnormal results are displayed) Labs Reviewed  CYTOLOGY, (ORAL, ANAL, URETHRAL) ANCILLARY ONLY    EKG   Radiology No results found.  Procedures Procedures (including critical care time)  Medications Ordered in UC Medications - No data to display  Initial Impression / Assessment and Plan / UC Course  I have reviewed the triage vital signs and the nursing notes.  Pertinent labs & imaging results that were available  during my care of the patient were reviewed by me and considered in my medical decision making (see chart for details).   STI labs pending.  Patient declines HIV and syphilis testing today.  Will notify patient of positive results and treat accordingly when labs come back.  Patient to avoid sexual intercourse until screening testing comes back.  Education provided regarding safe sexual practices and patient encouraged to use protection to prevent spread of STIs.   Discussed physical exam and available lab work findings in clinic with patient.  Counseled patient regarding appropriate use of medications and potential side effects for all medications recommended or prescribed today. Discussed red flag signs and symptoms of worsening condition,when to call the PCP office, return to urgent care, and when to seek higher level of care in the emergency department. Patient verbalizes understanding and agreement with plan. All questions answered. Patient discharged in stable condition.    Final Clinical Impressions(s) / UC Diagnoses   Final diagnoses:  Concern about STD in male without diagnosis  Possible exposure to STD     Discharge Instructions      Your STD testing has been sent to the lab and will come back in the next 2 to 3 days.  We will call you if any of your results are positive requiring treatment and treat you at that time.   If you do not receive a phone call from [place], this means your testing was negative.  Avoid sexual intercourse until your STD results come back.  If any of your STD results are positive, you will need to avoid sexual intercourse for 7 days while you are being treated to prevent spread of STD.  Condom use is the best way to prevent spread of STDs.  Return to urgent care as needed.    ED Prescriptions   None    PDMP not reviewed this encounter.   Talbot Grumbling, [place] 10/14/22 1429

## 2022-10-15 LAB — CYTOLOGY, (ORAL, ANAL, URETHRAL) ANCILLARY ONLY
Chlamydia: NEGATIVE
Comment: NEGATIVE
Comment: NEGATIVE
Comment: NORMAL
Neisseria Gonorrhea: NEGATIVE
Trichomonas: POSITIVE — AB

## 2022-10-16 ENCOUNTER — Telehealth (HOSPITAL_COMMUNITY): Payer: Self-pay | Admitting: Emergency Medicine

## 2022-10-16 MED ORDER — METRONIDAZOLE 500 MG PO TABS: 2000.0000 mg | ORAL_TABLET | Freq: Once | ORAL | 0 refills | Status: AC

## 2023-02-24 ENCOUNTER — Encounter (HOSPITAL_COMMUNITY): Payer: Self-pay

## 2023-02-24 ENCOUNTER — Ambulatory Visit (HOSPITAL_COMMUNITY)
Admission: RE | Admit: 2023-02-24 | Discharge: 2023-02-24 | Disposition: A | Discharge status: Home / Self Care | Payer: Self-pay | Source: Ambulatory Visit | Attending: Emergency Medicine | Admitting: Emergency Medicine

## 2023-02-24 VITALS — BP 94/56 | HR 93 | Temp 98.1°F | Resp 18

## 2023-02-24 DIAGNOSIS — Z113 Encounter for screening for infections with a predominantly sexual mode of transmission: Secondary | ICD-10-CM | POA: Insufficient documentation

## 2023-02-24 DIAGNOSIS — R051 Acute cough: Secondary | ICD-10-CM | POA: Insufficient documentation

## 2023-02-24 MED ORDER — BENZONATATE 100 MG PO CAPS: 100.0000 mg | ORAL_CAPSULE | Freq: Three times a day (TID) | ORAL | 0 refills | Status: DC | PRN

## 2023-02-24 NOTE — ED Provider Notes (Signed)
[facility]    CSN: [id number] Arrival date & time: 02/24/23  1437      History   Chief Complaint Chief Complaint  Patient presents with   SEXUALLY TRANSMITTED DISEASE    Entered by patient   Cough   Sinus Pressure    HPI Henry Lin is a 27 y.o. male.  Here for STD testing. No known exposure but reports unprotected intercourse. Denies any current symptoms. He has history of multiple STDs over the last 6 years. Reports he had negative "pee test" at the [facility] last week but wants to do the swab.  Also cough and congestion for 1 week. Seeming to improve recently. Cough somewhat productive. Was using mucinex that helped. No fever or shortness of breath Sick contact at home   Past Medical History:  Diagnosis Date   Crohn disease ([facility])    Crohn's disease ([facility])    Genital herpes     There are no problems to display for this patient.   History reviewed. No pertinent surgical history.     Home Medications    Prior to Admission medications   Medication Sig Start Date End Date Taking? Authorizing Provider  benzonatate (TESSALON) 100 MG capsule Take 1 capsule (100 mg total) by mouth 3 (three) times daily as needed for cough. 02/24/23  Yes Ambyr Qadri, Lurena Joiner, PA-C    Family History Family History  Problem Relation Age of Onset   Healthy Mother    Healthy Father     Social History Social History   Tobacco Use   Smoking status: Some Days    Types: Cigars   Smokeless tobacco: Never  Vaping Use   Vaping Use: Some days  Substance Use Topics   Alcohol use: Yes   Drug use: Yes    Types: Marijuana     Allergies   Patient has no known allergies.   Review of Systems Review of Systems  Respiratory:  Positive for cough.    Per HPI  Physical Exam Triage Vital Signs ED Triage Vitals  Enc Vitals Group     BP 02/24/23 1506 (!) 94/56     Pulse Rate 02/24/23 1506 93     Resp 02/24/23 1506 18     Temp 02/24/23 1506 98.1 F (36.7 C)      Temp Source 02/24/23 1506 Oral     SpO2 02/24/23 1506 98 %     Weight --      Height --      Head Circumference --      Peak Flow --      Pain Score 02/24/23 1507 0     Pain Loc --      Pain Edu? --      Excl. in [place]? --    No data found.  Updated Vital Signs BP (!) 94/56 (BP Location: Right Arm)   Pulse 93   Temp 98.1 F (36.7 C) (Oral)   Resp 18   SpO2 98%    Physical Exam Vitals and nursing note reviewed.  HENT:     Nose: Congestion present. No rhinorrhea.     Mouth/Throat:     Mouth: Mucous membranes are moist.     Pharynx: Oropharynx is clear. No posterior oropharyngeal erythema.  Eyes:     Conjunctiva/sclera: Conjunctivae normal.  Cardiovascular:     Rate and Rhythm: Normal rate and regular rhythm.     Pulses: Normal pulses.     Heart sounds: Normal heart sounds.  Pulmonary:  Effort: Pulmonary effort is normal. No respiratory distress.     Breath sounds: Normal breath sounds. No wheezing.  Musculoskeletal:     Cervical back: Normal range of motion.  Lymphadenopathy:     Cervical: No cervical adenopathy.  Skin:    General: Skin is warm and dry.  Neurological:     Mental Status: He is alert and oriented to person, place, and time.      UC Treatments / Results  Labs (all labs ordered are listed, but only abnormal results are displayed) Labs Reviewed  CYTOLOGY, (ORAL, ANAL, URETHRAL) ANCILLARY ONLY    EKG   Radiology No results found.  Procedures Procedures (including critical care time)  Medications Ordered in UC Medications - No data to display  Initial Impression / Assessment and Plan / UC Course  I have reviewed the triage vital signs and the nursing notes.  Pertinent labs & imaging results that were available during my care of the patient were reviewed by me and considered in my medical decision making (see chart for details).  Cyto swab is pending  Viral URI with cough. Continue mucinex, increase fluids. Can use tessalon TID prn.  He is already improving. Can return if needed. No questions at this time  Final Clinical Impressions(s) / UC Diagnoses   Final diagnoses:  Screen for STD (sexually transmitted disease)  Acute cough     Discharge Instructions      Continue mucinex. Increase water intake to at least 64 oz daily. This will help thin mucous. You can take the cough pills three times daily if needed. If the medicine makes you drowsy, take only one pill before bed. You can also try a nasal spray like flonase  We will call you if anything on your swab returns positive. Please abstain from sexual intercourse until your results return. Always use barrier protection. This is the only way to prevent sexually transmitted diseases.    ED Prescriptions     Medication Sig Dispense Auth. Provider   benzonatate (TESSALON) 100 MG capsule Take 1 capsule (100 mg total) by mouth 3 (three) times daily as needed for cough. 21 capsule Jhace Fennell, Lurena Joiner, PA-C      PDMP not reviewed this encounter.   Marlow Baars, [place] 02/24/23 1551

## 2023-02-24 NOTE — ED Triage Notes (Signed)
Pt reports for sinus pressure and cough x 1 week. States he has had a productive cough with green/brown mucous. Has been taking Mucinex,   Also requesting routine STD testing. Denies any current symptoms.

## 2023-02-24 NOTE — Discharge Instructions (Addendum)
Continue mucinex. Increase water intake to at least 64 oz daily. This will help thin mucous. You can take the cough pills three times daily if needed. If the medicine makes you drowsy, take only one pill before bed. You can also try a nasal spray like flonase  We will call you if anything on your swab returns positive. Please abstain from sexual intercourse until your results return. Always use barrier protection. This is the only way to prevent sexually transmitted diseases.

## 2023-02-25 LAB — CYTOLOGY, (ORAL, ANAL, URETHRAL) ANCILLARY ONLY
Chlamydia: NEGATIVE
Comment: NEGATIVE
Comment: NEGATIVE
Comment: NORMAL
Neisseria Gonorrhea: NEGATIVE
Trichomonas: NEGATIVE

## 2023-04-29 ENCOUNTER — Encounter (HOSPITAL_COMMUNITY): Payer: Self-pay

## 2023-04-29 ENCOUNTER — Ambulatory Visit (HOSPITAL_COMMUNITY)
Admission: RE | Admit: 2023-04-29 | Discharge: 2023-04-29 | Disposition: A | Discharge status: Home / Self Care | Payer: Self-pay | Source: Ambulatory Visit | Attending: Family Medicine | Admitting: Family Medicine

## 2023-04-29 VITALS — BP 117/68 | HR 72 | Temp 98.4°F | Resp 16 | Ht 69.0 in | Wt 160.0 lb

## 2023-04-29 DIAGNOSIS — Z202 Contact with and (suspected) exposure to infections with a predominantly sexual mode of transmission: Secondary | ICD-10-CM | POA: Insufficient documentation

## 2023-04-29 NOTE — Discharge Instructions (Signed)
Staff will notify you if there is anything positive on the swab

## 2023-04-29 NOTE — ED Triage Notes (Signed)
Patient was informed his partner is having symptoms, no diagnosis for them yet. Patient not having any current symptoms.

## 2023-04-29 NOTE — ED Provider Notes (Addendum)
[facility]    CSN: [id number] Arrival date & time: 04/29/23  1656      History   Chief Complaint Chief Complaint  Patient presents with   Exposure to STD    Entered by patient   Appointment    HPI Henry Lin is a 27 y.o. male.    Exposure to STD  Here for possible exposure to STD.  His partner has told him that she is having some pelvic pain and   her "pH balance is off".  This patient is not having any dysuria or penile discharge or itching.  He states occasionally has a tingle.  He did have screening done in June and was negative on a swab then.  He also had blood work for HIV and syphilis done at the health department and that was negative.  He would like to be screened for STDs today  Past Medical History:  Diagnosis Date   Crohn disease ([facility])    Crohn's disease ([facility])    Genital herpes     There are no problems to display for this patient.   History reviewed. No pertinent surgical history.     Home Medications    Prior to Admission medications   Not on File    Family History Family History  Problem Relation Age of Onset   Healthy Mother    Healthy Father     Social History Social History   Tobacco Use   Smoking status: Some Days    Types: Cigars   Smokeless tobacco: Never  Vaping Use   Vaping status: Some Days  Substance Use Topics   Alcohol use: Yes   Drug use: Yes    Types: Marijuana     Allergies   Patient has no known allergies.   Review of Systems Review of Systems   Physical Exam Triage Vital Signs ED Triage Vitals  Encounter Vitals Group     BP 04/29/23 1711 117/68     Systolic BP Percentile --      Diastolic BP Percentile --      Pulse Rate 04/29/23 1711 72     Resp 04/29/23 1711 16     Temp 04/29/23 1711 98.4 F (36.9 C)     Temp Source 04/29/23 1711 Oral     SpO2 04/29/23 1711 98 %     Weight 04/29/23 1710 160 lb (72.6 kg)     Height 04/29/23 1710 5\' 9"  (1.753 m)     Head Circumference  --      Peak Flow --      Pain Score 04/29/23 1710 0     Pain Loc --      Pain Education --      Exclude from Growth Chart --    No data found.  Updated Vital Signs BP 117/68 (BP Location: Left Arm)   Pulse 72   Temp 98.4 F (36.9 C) (Oral)   Resp 16   Ht 5\' 9"  (1.753 m)   Wt 72.6 kg   SpO2 98%   BMI 23.63 kg/m   Visual Acuity Right Eye Distance:   Left Eye Distance:   Bilateral Distance:    Right Eye Near:   Left Eye Near:    Bilateral Near:     Physical Exam Vitals reviewed.  Constitutional:      General: He is not in acute distress.    Appearance: He is not ill-appearing, toxic-appearing or diaphoretic.  Skin:    Coloration: Skin is  not pale.  Neurological:     Mental Status: He is alert and oriented to person, place, and time.  Psychiatric:        Behavior: Behavior normal.      UC Treatments / Results  Labs (all labs ordered are listed, but only abnormal results are displayed) Labs Reviewed  CYTOLOGY, (ORAL, ANAL, URETHRAL) ANCILLARY ONLY    EKG   Radiology No results found.  Procedures Procedures (including critical care time)  Medications Ordered in UC Medications - No data to display  Initial Impression / Assessment and Plan / UC Course  I have reviewed the triage vital signs and the nursing notes.  Pertinent labs & imaging results that were available during my care of the patient were reviewed by me and considered in my medical decision making (see chart for details).        Urethral self swab is done and staff will notify him of any positives and treat per protocol. We will not do blood work today since he just had it done 2 months ago. Final Clinical Impressions(s) / UC Diagnoses   Final diagnoses:  Exposure to STD     Discharge Instructions      Staff will notify you if there is anything positive on the swab.      ED Prescriptions   None    PDMP not reviewed this encounter.   Zenia Resides, MD 04/29/23  1736    Zenia Resides, MD 04/29/23 [phone]

## 2023-06-24 ENCOUNTER — Encounter (HOSPITAL_COMMUNITY): Payer: Self-pay

## 2023-06-24 ENCOUNTER — Ambulatory Visit (HOSPITAL_COMMUNITY)
Admission: RE | Admit: 2023-06-24 | Discharge: 2023-06-24 | Disposition: A | Discharge status: Home / Self Care | Payer: Self-pay | Source: Ambulatory Visit | Attending: Internal Medicine | Admitting: Internal Medicine

## 2023-06-24 VITALS — BP 123/76 | HR 68 | Temp 98.5°F | Resp 16

## 2023-06-24 DIAGNOSIS — Q386 Other congenital malformations of mouth: Secondary | ICD-10-CM | POA: Insufficient documentation

## 2023-06-24 DIAGNOSIS — Z9189 Other specified personal risk factors, not elsewhere classified: Secondary | ICD-10-CM | POA: Insufficient documentation

## 2023-06-24 NOTE — ED Triage Notes (Signed)
Patient is here for STD testing. Denies any symptoms.

## 2023-06-24 NOTE — Discharge Instructions (Addendum)

## 2023-06-24 NOTE — ED Provider Notes (Signed)
[facility]    CSN: [id number] Arrival date & time: 06/24/23  6387      History   Chief Complaint Chief Complaint  Patient presents with   SEXUALLY TRANSMITTED DISEASE    Annual check up - Entered by patient    HPI Henry Lin is a 27 y.o. male.   Henry Lin is a 27 y.o. male presenting to urgent care requesting STI testing.  Currently asymptomatic and without recent known STI exposure.  Sexually active with single male partner(s) without protection. Denies urinary symptoms, N/V/D, pelvic/abdominal pain, new low back pain, fever/chills.  No penile discharge, odor, or itch.  He would like for provider to assess some "bumps" to the penile shaft that sometimes pop up on the penis and go away eventually intermittently for the last few weeks. Bumps are non-tender, non-draining, and without ulceration. History of herpes simplex virus.      Past Medical History:  Diagnosis Date   Crohn disease (HCC)    Crohn's disease (HCC)    Genital herpes     There are no problems to display for this patient.   History reviewed. No pertinent surgical history.     Home Medications    Prior to Admission medications   Not on File    Family History Family History  Problem Relation Age of Onset   Healthy Mother    Healthy Father     Social History Social History   Tobacco Use   Smoking status: Some Days    Types: Cigars   Smokeless tobacco: Never  Vaping Use   Vaping status: Some Days  Substance Use Topics   Alcohol use: Yes   Drug use: Yes    Types: Marijuana     Allergies   Patient has no known allergies.   Review of Systems Review of Systems Per HPI  Physical Exam Triage Vital Signs ED Triage Vitals [06/24/23 1007]  Encounter Vitals Group     BP 123/76     Systolic BP Percentile      Diastolic BP Percentile      Pulse Rate 68     Resp 16     Temp 98.5 F (36.9 C)     Temp Source Oral     SpO2 98 %     Weight       Height      Head Circumference      Peak Flow      Pain Score 0     Pain Loc      Pain Education      Exclude from Growth Chart    No data found.  Updated Vital Signs BP 123/76 (BP Location: Left Arm)   Pulse 68   Temp 98.5 F (36.9 C) (Oral)   Resp 16   SpO2 98%   Visual Acuity Right Eye Distance:   Left Eye Distance:   Bilateral Distance:    Right Eye Near:   Left Eye Near:    Bilateral Near:     Physical Exam Vitals and nursing note reviewed. Exam conducted with a chaperone present Lyla Son, [employer] fundraiser).  Constitutional:      Appearance: He is not ill-appearing or toxic-appearing.  HENT:     Head: Normocephalic and atraumatic.     Right Ear: Hearing and external ear normal.     Left Ear: Hearing and external ear normal.     Nose: Nose normal.     Mouth/Throat:     Lips: Pink.  Eyes:     General: Lids are normal. Vision grossly intact. Gaze aligned appropriately.     Extraocular Movements: Extraocular movements intact.     Conjunctiva/sclera: Conjunctivae normal.  Pulmonary:     Effort: Pulmonary effort is normal.  Genitourinary:    Pubic Area: No rash.      Penis: Circumcised. Lesions (Fordyce spots) present.      Testes: Normal.     Comments: 5-6 Fordyce spots present to the left penile shaft. No pustular lesions, tenderness, or ulceration/drainage.  Musculoskeletal:     Cervical back: Neck supple.  Skin:    General: Skin is warm and dry.     Capillary Refill: Capillary refill takes less than 2 seconds.     Findings: No rash.  Neurological:     General: No focal deficit present.     Mental Status: He is alert and oriented to person, place, and time. Mental status is at baseline.     Cranial Nerves: No dysarthria or facial asymmetry.  Psychiatric:        Mood and Affect: Mood normal.        Speech: Speech normal.        Behavior: Behavior normal.        Thought Content: Thought content normal.        Judgment: Judgment normal.      UC Treatments /  Results  Labs (all labs ordered are listed, but only abnormal results are displayed) Labs Reviewed  CYTOLOGY, (ORAL, ANAL, URETHRAL) ANCILLARY ONLY    EKG   Radiology No results found.  Procedures Procedures (including critical care time)  Medications Ordered in UC Medications - No data to display  Initial Impression / Assessment and Plan / UC Course  I have reviewed the triage vital signs and the nursing notes.  Pertinent labs & imaging results that were available during my care of the patient were reviewed by me and considered in my medical decision making (see chart for details).   1. At risk for STD due to unprotected sex, fordyce spots STI labs pending, will notify patient of positive results and treat accordingly per protocol when labs result.  Patient declines HIV and syphilis testing today.   Patient to avoid sexual intercourse until screening testing comes back.   Education provided regarding safe sexual practices and patient encouraged to use protection to prevent spread of STIs.    No signs of HSV outbreak.  Penile lesions consistent with Fordyce spots.  Discussed incidence of these lesions and management at length with patient.  Counseled patient on potential for adverse effects with medications prescribed/recommended today, strict ER and return-to-clinic precautions discussed, patient verbalized understanding.    Final Clinical Impressions(s) / UC Diagnoses   Final diagnoses:  At risk for sexually transmitted disease due to unprotected sex  Fordyce spots     Discharge Instructions      STD testing pending, this will take 2-3 days to result. We will only call you if your testing is positive for any infection(s) and we will provide treatment.  Avoid sexual intercourse until your STD results come back.  If any of your STD results are positive, you will need to avoid sexual intercourse for 7 days while you are being treated to prevent spread of STD.  Condom  use is the best way to prevent spread of STDs. Notify partner(s) of any positive results.  Return to urgent care as needed.     ED Prescriptions   None  PDMP not reviewed this encounter.   Carlisle Beers, [place] 06/24/23 1041

## 2023-06-26 ENCOUNTER — Telehealth (HOSPITAL_COMMUNITY): Payer: Self-pay | Admitting: Emergency Medicine

## 2023-06-26 MED ORDER — DOXYCYCLINE HYCLATE 100 MG PO CAPS: 100.0000 mg | ORAL_CAPSULE | Freq: Two times a day (BID) | ORAL | 0 refills | Status: AC

## 2023-06-26 NOTE — Telephone Encounter (Signed)
Positive for chlamydia. Patient called requesting treatment. Sent doxy to pharmacy on file. take BID x 7 days Safe sex practices, notify partners, abstain for 1 week after treatment.

## 2023-06-28 ENCOUNTER — Telehealth: Payer: Self-pay

## 2023-06-28 LAB — CYTOLOGY, (ORAL, ANAL, URETHRAL) ANCILLARY ONLY
Chlamydia: POSITIVE — AB
Comment: NEGATIVE
Comment: NEGATIVE
Comment: NORMAL
Neisseria Gonorrhea: NEGATIVE
Trichomonas: NEGATIVE

## 2023-06-28 NOTE — Telephone Encounter (Signed)
Created in error

## 2024-10-05 ENCOUNTER — Ambulatory Visit (HOSPITAL_COMMUNITY): Payer: Self-pay

## 2024-10-09 ENCOUNTER — Ambulatory Visit (HOSPITAL_COMMUNITY)

## 2024-10-25 ENCOUNTER — Inpatient Hospital Stay (HOSPITAL_COMMUNITY): Admission: RE | Admit: 2024-10-25 | Discharge: 2024-10-25

## 2024-10-25 ENCOUNTER — Encounter (HOSPITAL_COMMUNITY): Payer: Self-pay

## 2024-10-25 VITALS — BP 117/72 | HR 69 | Temp 98.6°F | Resp 18

## 2024-10-25 DIAGNOSIS — K047 Periapical abscess without sinus: Secondary | ICD-10-CM

## 2024-10-25 DIAGNOSIS — Z113 Encounter for screening for infections with a predominantly sexual mode of transmission: Secondary | ICD-10-CM

## 2024-10-25 DIAGNOSIS — R300 Dysuria: Secondary | ICD-10-CM | POA: Diagnosis not present

## 2024-10-25 LAB — POCT URINE DIPSTICK
Bilirubin, UA: NEGATIVE
Blood, UA: NEGATIVE
Glucose, UA: NEGATIVE mg/dL
Ketones, POC UA: NEGATIVE mg/dL
Nitrite, UA: NEGATIVE
POC PROTEIN,UA: NEGATIVE
Spec Grav, UA: 1.015
Urobilinogen, UA: 0.2 U/dL
pH, UA: 6.5

## 2024-10-25 MED ORDER — AMOXICILLIN-POT CLAVULANATE 875-125 MG PO TABS: 1.0000 | ORAL_TABLET | Freq: Two times a day (BID) | ORAL | 0 refills | Status: AC

## 2024-10-25 MED ORDER — HYDROCODONE-ACETAMINOPHEN 5-325 MG PO TABS: 1.0000 | ORAL_TABLET | Freq: Four times a day (QID) | ORAL | 0 refills | Status: AC | PRN

## 2024-10-25 MED ORDER — CHLORHEXIDINE GLUCONATE 0.12 % MT SOLN: 15.0000 mL | Freq: Two times a day (BID) | OROMUCOSAL | 0 refills | Status: AC

## 2024-10-25 NOTE — Discharge Instructions (Addendum)
 Your urinalysis suggests infection.  Your urine is sent for culture to confirm.  You will be notified via telephone if you need an additional antibiotic for your urinary tract infection.  You have been prescribed Augmentin  which should cover urinary tract infection and dental infection.  STD testing pending, this will take 2-3 days to result. We will only call you if your testing is positive for any infection(s) and we will provide treatment.  Avoid sexual intercourse until your STD results come back.  If any of your STD results are positive, you will need to avoid sexual intercourse for 7 days while you are being treated to prevent spread of STD.  Condom use is the best way to prevent spread of STDs. Notify partner(s) of any positive results.  Return to urgent care as needed.    For your dental infection: You have been prescribed Peridex .  Swish 15 mL in your mouth and spit out twice a day for 7 days. You have been prescribed hydrocodone -acetaminophen .  Take 1 tablet every 6 hours as needed for severe pain. Take Tylenol  and/or ibuprofen  for pain, fever.  Please follow-up with a dentist for definitive care.  If you develop any new or worsening symptoms or if your symptoms do not start to improve, please return here or follow-up with your primary care provider.  If your symptoms are severe, please go to the emergency department.

## 2024-10-25 NOTE — ED Provider Notes (Signed)
 " [facility]    CSN: [id number] Arrival date & time: 10/25/24  0916      History   Chief Complaint Chief Complaint  Patient presents with   SEXUALLY TRANSMITTED DISEASE    Having symptoms - Entered by patient    HPI Henry Lin is a 29 y.o. male.   This 29 year old male is being seen for complaints of right upper dental infection, tingling and burning in his penis as well as dysuria.  He endorses clear penile discharge when urinating.  He reports seeing a dentist approximately 2-3 weeks ago and was advised he needed a root canal.  Unfortunately, he does not have the funds to get this completed at this time.  He reports penile tingling and burning and dysuria for 1 week.  He would like STI screening with swab only.  He denies fever, chills, headache, dizziness.  He denies oral lesions, sore throat.  He denies chest pain, shortness of breath, abdominal pain, nausea, vomiting.  He denies genital lesions at this time, he does have a history of genital herpes.     Past Medical History:  Diagnosis Date   Crohn disease (HCC)    Crohn's disease (HCC)    Genital herpes     There are no active problems to display for this patient.   History reviewed. No pertinent surgical history.     Home Medications    Prior to Admission medications  Medication Sig Start Date End Date Taking? Authorizing Provider  amoxicillin -clavulanate (AUGMENTIN ) 875-125 MG tablet Take 1 tablet by mouth every 12 (twelve) hours. 10/25/24  Yes Sophina Mitten C, FNP  chlorhexidine  (PERIDEX ) 0.12 % solution Use as directed 15 mLs in the mouth or throat 2 (two) times daily for 7 days. Swish 15 mL around mouth and spit out 2 times a day for 7 days. 10/25/24 11/01/24 Yes Shakinah Navis C, FNP  HYDROcodone -acetaminophen  (NORCO/VICODIN) 5-325 MG tablet Take 1 tablet by mouth every 6 (six) hours as needed. 10/25/24  Yes Lennice Jon BROCKS, FNP    Family History Family History  Problem Relation Age of Onset    Healthy Mother    Healthy Father     Social History Social History[1]   Allergies   Patient has no known allergies.   Review of Systems Review of Systems  Constitutional:  Negative for appetite change, chills, fatigue and fever.  HENT:  Negative for mouth sores, sore throat and trouble swallowing.   Respiratory:  Negative for shortness of breath.   Cardiovascular:  Negative for chest pain.  Gastrointestinal:  Negative for abdominal pain, nausea and vomiting.  Genitourinary:  Positive for penile discharge. Negative for difficulty urinating, dysuria, frequency, genital sores, penile pain, penile swelling, scrotal swelling, testicular pain and urgency.  Skin:  Negative for color change and rash.  All other systems reviewed and are negative.    Physical Exam Triage Vital Signs ED Triage Vitals [10/25/24 0933]  Encounter Vitals Group     BP 117/72     Girls Systolic BP Percentile      Girls Diastolic BP Percentile      Boys Systolic BP Percentile      Boys Diastolic BP Percentile      Pulse Rate 69     Resp 18     Temp 98.6 F (37 C)     Temp Source Oral     SpO2 98 %     Weight      Height  Head Circumference      Peak Flow      Pain Score      Pain Loc      Pain Education      Exclude from Growth Chart    No data found.  Updated Vital Signs BP 117/72 (BP Location: Left Arm)   Pulse 69   Temp 98.6 F (37 C) (Oral)   Resp 18   SpO2 98%   Visual Acuity Right Eye Distance:   Left Eye Distance:   Bilateral Distance:    Right Eye Near:   Left Eye Near:    Bilateral Near:     Physical Exam Vitals and nursing note reviewed.  Constitutional:      Appearance: He is well-developed. He is not toxic-appearing.     Comments: Pleasant male appearing stated age found sitting in chair in no acute distress.  HENT:     Head: Normocephalic and atraumatic.     Mouth/Throat:     Lips: Pink.     Mouth: Mucous membranes are moist.     Dentition: Abnormal  dentition. Dental tenderness and dental abscesses present.     Comments: Right upper Eyes:     Conjunctiva/sclera: Conjunctivae normal.  Cardiovascular:     Rate and Rhythm: Normal rate and regular rhythm.     Heart sounds: Normal heart sounds. No murmur heard. Pulmonary:     Effort: Pulmonary effort is normal.     Breath sounds: Normal breath sounds.  Abdominal:     General: Bowel sounds are normal.     Palpations: Abdomen is soft.     Tenderness: There is no abdominal tenderness.  Skin:    General: Skin is warm and dry.  Neurological:     Mental Status: He is alert and oriented to person, place, and time.  Psychiatric:        Attention and Perception: Attention normal.        Mood and Affect: Mood normal.        Speech: Speech normal.        Behavior: Behavior normal.        Thought Content: Thought content normal.        Cognition and Memory: Cognition normal.        Judgment: Judgment normal.      UC Treatments / Results  Labs (all labs ordered are listed, but only abnormal results are displayed) Labs Reviewed  POCT URINE DIPSTICK - Abnormal; Notable for the following components:      Result Value   Clarity, UA cloudy (*)    Leukocytes, UA Moderate (2+) (*)    All other components within normal limits  URINE CULTURE  CYTOLOGY, (ORAL, ANAL, URETHRAL) ANCILLARY ONLY    EKG   Radiology No results found.  Procedures Procedures (including critical care time)  Medications Ordered in UC Medications - No data to display  Initial Impression / Assessment and Plan / UC Course  I have reviewed the triage vital signs and the nursing notes.  Pertinent labs & imaging results that were available during my care of the patient were reviewed by me and considered in my medical decision making (see chart for details).     Vitals and triage reviewed, patient is hemodynamically stable.  Urinalysis suspicious for infection with cloudy urine and moderate leukocytes.  Urine  culture sent.  Cytology swab obtained.  He declines blood work at today's visit.  Staff will notify patient for abnormal results to initiate appropriate treatment.  For dental infection, he is given prescription Peridex  oral solution, short course of Norco.  He is given prescription for Augmentin  which should cover UTI and dental infection.  Advised supportive care with Tylenol  and/or ibuprofen .  Plan of care, follow-up care, return precautions given, no questions at this time. Final Clinical Impressions(s) / UC Diagnoses   Final diagnoses:  Dysuria  Dental infection  Screening examination for STI     Discharge Instructions      Your urinalysis suggests infection.  Your urine is sent for culture to confirm.  You will be notified via telephone if you need an additional antibiotic for your urinary tract infection.  You have been prescribed Augmentin  which should cover urinary tract infection and dental infection.  STD testing pending, this will take 2-3 days to result. We will only call you if your testing is positive for any infection(s) and we will provide treatment.  Avoid sexual intercourse until your STD results come back.  If any of your STD results are positive, you will need to avoid sexual intercourse for 7 days while you are being treated to prevent spread of STD.  Condom use is the best way to prevent spread of STDs. Notify partner(s) of any positive results.  Return to urgent care as needed.    For your dental infection: You have been prescribed Peridex .  Swish 15 mL in your mouth and spit out twice a day for 7 days. You have been prescribed hydrocodone -acetaminophen .  Take 1 tablet every 6 hours as needed for severe pain. Take Tylenol  and/or ibuprofen  for pain, fever.  Please follow-up with a dentist for definitive care.  If you develop any new or worsening symptoms or if your symptoms do not start to improve, please return here or follow-up with your primary care  provider.  If your symptoms are severe, please go to the emergency department.     ED Prescriptions     Medication Sig Dispense Auth. Provider   amoxicillin -clavulanate (AUGMENTIN ) 875-125 MG tablet Take 1 tablet by mouth every 12 (twelve) hours. 14 tablet Adyson Vanburen C, FNP   chlorhexidine  (PERIDEX ) 0.12 % solution Use as directed 15 mLs in the mouth or throat 2 (two) times daily for 7 days. Swish 15 mL around mouth and spit out 2 times a day for 7 days. 210 mL Kaliyah Gladman C, FNP   HYDROcodone -acetaminophen  (NORCO/VICODIN) 5-325 MG tablet Take 1 tablet by mouth every 6 (six) hours as needed. 6 tablet Mansel Strother C, FNP      I have reviewed the PDMP during this encounter.    [1]  Social History Tobacco Use   Smoking status: Some Days    Types: Cigars   Smokeless tobacco: Never  Vaping Use   Vaping status: Some Days  Substance Use Topics   Alcohol use: Yes   Drug use: Yes    Types: Marijuana     Lennice Jon BROCKS, FNP 10/25/24 1005  "

## 2024-10-25 NOTE — ED Triage Notes (Signed)
 Pt presents to the office for right upper tooth pain.  Patient would like STI testing. Reports tinging and burning x 1 week.

## 2024-10-26 ENCOUNTER — Telehealth (HOSPITAL_COMMUNITY): Payer: Self-pay

## 2024-10-26 LAB — URINE CULTURE: Culture: NO GROWTH

## 2024-10-26 LAB — CYTOLOGY, (ORAL, ANAL, URETHRAL) ANCILLARY ONLY
Chlamydia: NEGATIVE
Comment: NEGATIVE
Comment: NEGATIVE
Comment: NORMAL
Neisseria Gonorrhea: NEGATIVE
Trichomonas: POSITIVE — AB

## 2024-10-26 MED ORDER — METRONIDAZOLE 500 MG PO TABS: ORAL_TABLET | ORAL | 0 refills | Status: AC

## 2024-10-26 NOTE — Telephone Encounter (Signed)
 Patient tested positive for trich and will need to be treated with flagyl . Flagyl  has been sent to pharmacy per protocol.

## 2024-10-27 ENCOUNTER — Ambulatory Visit (HOSPITAL_COMMUNITY): Payer: Self-pay

## 2024-10-27 ENCOUNTER — Telehealth (HOSPITAL_COMMUNITY): Payer: Self-pay | Admitting: *Deleted

## 2024-10-27 MED ORDER — DOXYCYCLINE HYCLATE 100 MG PO CAPS: 100.0000 mg | ORAL_CAPSULE | Freq: Two times a day (BID) | ORAL | 0 refills | Status: AC

## 2024-10-27 NOTE — Telephone Encounter (Signed)
 Pt questioning if he needs to take augmentin , doxy and flagyl .   Spoke to provider and she advised only needs augmentin  for dental infection and flagyl  for trich. Provider spoke to pt to pt to clear up confusion about meds.
